# Patient Record
Sex: Male | Born: 1980 | Race: Black or African American | Hispanic: No | Marital: Single | Smoking: Never smoker
Health system: Southern US, Community
[De-identification: ages and names within clinical notes are randomized; demographics above are authoritative.]

## PROBLEM LIST (undated history)

## (undated) DIAGNOSIS — G43909 Migraine, unspecified, not intractable, without status migrainosus: Secondary | ICD-10-CM

## (undated) HISTORY — PX: KNEE SURGERY: SHX244

---

## 2011-04-10 ENCOUNTER — Encounter (HOSPITAL_COMMUNITY): Payer: Self-pay | Admitting: Cardiology

## 2011-04-10 ENCOUNTER — Emergency Department (INDEPENDENT_AMBULATORY_CARE_PROVIDER_SITE_OTHER)
Admission: EM | Admit: 2011-04-10 | Discharge: 2011-04-10 | Disposition: A | Discharge status: Home / Self Care | Payer: BC Managed Care – PPO | Source: Home / Self Care | Attending: Emergency Medicine | Admitting: Emergency Medicine

## 2011-04-10 DIAGNOSIS — S46819A Strain of other muscles, fascia and tendons at shoulder and upper arm level, unspecified arm, initial encounter: Secondary | ICD-10-CM

## 2011-04-10 DIAGNOSIS — H6120 Impacted cerumen, unspecified ear: Secondary | ICD-10-CM

## 2011-04-10 DIAGNOSIS — S43499A Other sprain of unspecified shoulder joint, initial encounter: Secondary | ICD-10-CM

## 2011-04-10 MED ORDER — CYCLOBENZAPRINE HCL 5 MG PO TABS
5.0000 mg | ORAL_TABLET | Freq: Three times a day (TID) | ORAL | Status: AC | PRN
Start: 2011-04-10 — End: 2011-04-20

## 2011-04-10 MED ORDER — DICLOFENAC SODIUM 75 MG PO TBEC: 75.0000 mg | DELAYED_RELEASE_TABLET | Freq: Two times a day (BID) | ORAL | Status: DC

## 2011-04-10 NOTE — ED Notes (Signed)
Pt c/o bilat ear fullness. He has had swimmers ear in the past. The pt also report right shoulder pain. Denies known injury. Using eagle oil to right shoulder with little relief. Took tylenol 1 gram with no relief. Denies fever.

## 2011-04-10 NOTE — ED Provider Notes (Signed)
History     CSN: [id number]  Arrival date & time 04/10/11  1607   First MD Initiated Contact with Patient 04/10/11 1617      Chief Complaint  Patient presents with  . Otitis Externa  . Shoulder Pain    (Consider location/radiation/quality/duration/timing/severity/associated sxs/prior treatment) HPI Comments: Isaiah Morales is a 31 year old male who is in today for 2 complaints, ear congestion and right trapezius ridge pain.  His ear congestion has persisted for the past 1-1/2 weeks. He's tried to remove it with Q-tips. He denies any pain or drainage. He's had no nasal congestion, rhinorrhea, sore throat, or cough.  He also has a two-day history of right posterior trapezius pain. This came on after he was doing some heavy lifting at work at [employer]. It's worse if he does any lifting or activity and better with rest. He has a full range of motion with no pain. There is no pain radiating down the arm, numbness, tingling, or muscle weakness.  Patient is a 31 y.o. male presenting with shoulder pain.  Shoulder Pain Pertinent negatives include no abdominal pain and no shortness of breath.    History reviewed. No pertinent past medical history.  Past Surgical History  Procedure Date  . Knee surgery     right x 2    History reviewed. No pertinent family history.  History  Substance Use Topics  . Smoking status: Never Smoker   . Smokeless tobacco: Not on file  . Alcohol Use: No      Review of Systems  Constitutional: Negative for fever, chills and fatigue.  HENT: Positive for hearing loss. Negative for ear pain, congestion, sore throat, rhinorrhea, sneezing, neck stiffness, voice change and postnasal drip.   Eyes: Negative for pain, discharge and redness.  Respiratory: Negative for cough, chest tightness, shortness of breath and wheezing.   Gastrointestinal: Negative for nausea, vomiting, abdominal pain and diarrhea.  Musculoskeletal: Positive for arthralgias. Negative for myalgias,  back pain, joint swelling and gait problem.  Skin: Negative for rash and wound.  Neurological: Negative for weakness and numbness.    Allergies  Review of patient's allergies indicates no known allergies.  Home Medications   Current Outpatient Rx  Name Route Sig Dispense Refill  . CYCLOBENZAPRINE HCL 5 MG PO TABS Oral Take 1 tablet (5 mg total) by mouth 3 (three) times daily as needed for muscle spasms. 30 tablet 0  . DICLOFENAC SODIUM 75 MG PO TBEC Oral Take 1 tablet (75 mg total) by mouth 2 (two) times daily. 20 tablet 0    BP 115/60  Pulse 69  Temp(Src) 98.8 F (37.1 C) (Oral)  Resp 16  SpO2 100%  Physical Exam  Nursing note and vitals reviewed. Constitutional: He is oriented to person, place, and time. He appears well-developed and well-nourished. No distress.  HENT:  Head: Normocephalic and atraumatic.  Right Ear: External ear normal.  Left Ear: External ear normal.  Nose: Nose normal.  Mouth/Throat: Oropharynx is clear and moist. No oropharyngeal exudate.       There were cerumen impactions in both ear canals.  Eyes: Conjunctivae and EOM are normal. Pupils are equal, round, and reactive to light. Right eye exhibits no discharge. Left eye exhibits no discharge.  Neck: Normal range of motion. Neck supple.  Cardiovascular: Normal rate, regular rhythm and normal heart sounds.   Pulmonary/Chest: Effort normal and breath sounds normal. No stridor. No respiratory distress. He has no wheezes. He has no rales. He exhibits no tenderness.  Musculoskeletal: Normal  range of motion. He exhibits tenderness. He exhibits no edema.       Exam of the right shoulder reveals pain to palpation over the posterior trapezius ridge. The shoulder had a full range of motion actively and passively with just a little bit of pain on internal and external rotation. Muscle strength was normal. Impingement signs are negative.  Lymphadenopathy:    He has no cervical adenopathy.  Neurological: He is alert  and oriented to person, place, and time. He has normal strength and normal reflexes. He displays no atrophy. No sensory deficit. He exhibits normal muscle tone.  Skin: Skin is warm and dry. No rash noted. He is not diaphoretic.    ED Course  Procedures (including critical care time)  Both ears were irrigated out with plain water. Thereafter the ear canals and TMs appear normal.  Labs Reviewed - No data to display No results found.   1. Cerumen impaction   2. Trapezius strain       MDM  He was given some shoulder exercises to do and suggest a followup in 2 weeks with Dr. Magnus Ivan if no improvement.        Roque Lias, MD 04/10/11 2105

## 2011-04-23 ENCOUNTER — Telehealth (HOSPITAL_COMMUNITY): Payer: Self-pay | Admitting: *Deleted

## 2011-04-23 NOTE — ED Notes (Signed)
1/14 Pt. brought FMLA papers due to work note restrictions for 14 days 1/9-1/23.  Dr. Lorenz Coaster filled out papers and I faxed them with patients record to [facility] attn. Tresa Endo 1/14 and confirmation received. I called pt. to pick up originals. Isaiah Morales 04/23/2011

## 2011-09-18 ENCOUNTER — Emergency Department (HOSPITAL_COMMUNITY)
Admission: EM | Admit: 2011-09-18 | Discharge: 2011-09-18 | Disposition: A | Discharge status: Home / Self Care | Payer: BC Managed Care – PPO | Source: Home / Self Care | Attending: Emergency Medicine | Admitting: Emergency Medicine

## 2011-09-18 ENCOUNTER — Encounter (HOSPITAL_COMMUNITY): Payer: Self-pay | Admitting: *Deleted

## 2011-09-18 DIAGNOSIS — R51 Headache: Secondary | ICD-10-CM

## 2011-09-18 DIAGNOSIS — Z569 Unspecified problems related to employment: Secondary | ICD-10-CM

## 2011-09-18 DIAGNOSIS — Z566 Other physical and mental strain related to work: Secondary | ICD-10-CM

## 2011-09-18 MED ORDER — ACETAMINOPHEN-CODEINE #3 300-30 MG PO TABS: 1.0000 | ORAL_TABLET | Freq: Four times a day (QID) | ORAL | Status: AC | PRN

## 2011-09-18 NOTE — Discharge Instructions (Signed)

## 2011-09-18 NOTE — ED Notes (Signed)
Pt with c/o headache onset of  headache today under increased stress at work - pt worried bp might be elevated - having frequent headaches

## 2011-09-18 NOTE — ED Provider Notes (Signed)
History     CSN: [id number]  Arrival date & time 09/18/11  1513   First MD Initiated Contact with Patient 09/18/11 1515      Chief Complaint  Patient presents with  . Headache    (Consider location/radiation/quality/duration/timing/severity/associated sxs/prior treatment) HPI Comments: Since urgent care today complaining of a headache, just wants to check and make sure this is not elated to high blood pressure. She goes into describing how he's been having discussions with his supervisor and today felt that he might to decide leaving this job in order to not compromise his integrity. The headache is not constant and only low localizes for head area. Patient denies any further symptoms such as numbness, weakness, nausea vomiting or visual disturbances. Occasionally takes some over-the-counter medicines like ibuprofen with some partial relief but headaches tend to come back.  Patient denies any constitutional symptoms such as fevers, unintentional weight loss, bodyaches, etc.  Patient is a 31 y.o. male presenting with headaches. The history is provided by the patient.  Headache The primary symptoms include headaches and vomiting. Primary symptoms do not include loss of consciousness, altered mental status, seizures, dizziness, visual change, loss of sensation, speech change, memory loss, fever or nausea. The symptoms are unchanged. Context: current episodeafter having had discussion at work place.  The headache is not associated with eye pain, visual change, neck stiffness or weakness.  Additional symptoms do not include neck stiffness or weakness.    History reviewed. No pertinent past medical history.  Past Surgical History  Procedure Date  . Knee surgery     right x 2    History reviewed. No pertinent family history.  History  Substance Use Topics  . Smoking status: Never Smoker   . Smokeless tobacco: Not on file  . Alcohol Use: No      Review of Systems    Constitutional: Negative for fever, chills, activity change and appetite change.  HENT: Negative for neck stiffness.   Eyes: Negative for pain and visual disturbance.  Respiratory: Negative for shortness of breath.   Gastrointestinal: Positive for vomiting. Negative for nausea.  Skin: Negative for rash.  Neurological: Positive for headaches. Negative for dizziness, tremors, speech change, seizures, loss of consciousness, syncope, facial asymmetry, speech difficulty, weakness, light-headedness and numbness.  Psychiatric/Behavioral: Negative for memory loss and altered mental status.    Allergies  Review of patient's allergies indicates no known allergies.  Home Medications   Current Outpatient Rx  Name Route Sig Dispense Refill  . ACETAMINOPHEN-CODEINE #3 300-30 MG PO TABS Oral Take 1-2 tablets by mouth every 6 (six) hours as needed for pain. 15 tablet 0    BP 111/61  Pulse 70  Temp 98.5 F (36.9 C) (Oral)  Resp 16  SpO2 99%  Physical Exam  Nursing note and vitals reviewed. Constitutional: He is oriented to person, place, and time. He appears well-developed and well-nourished.  Non-toxic appearance. He does not have a sickly appearance. He does not appear ill. No distress.  Eyes: Conjunctivae and EOM are normal. Pupils are equal, round, and reactive to light. Right eye exhibits no discharge.  Neck: Neck supple. No JVD present.  Cardiovascular: Normal rate, regular rhythm, normal heart sounds and intact distal pulses.  Exam reveals no gallop and no friction rub.   No murmur heard. Lymphadenopathy:    He has no cervical adenopathy.  Neurological: He is alert and oriented to person, place, and time. He displays no atrophy, no tremor and normal reflexes. No cranial nerve  deficit or sensory deficit. He exhibits normal muscle tone. He displays no seizure activity. Coordination and gait normal.  Skin: Skin is warm. No erythema.    ED Course  Procedures (including critical care  time)  Labs Reviewed - No data to display No results found.   1. Headache   2. Stress at work       MDM  Headache with no further neurological symptoms. Patient with work-related stress.        Jimmie Molly, MD 09/18/11 [phone]

## 2011-12-01 ENCOUNTER — Encounter (HOSPITAL_COMMUNITY): Payer: Self-pay | Admitting: *Deleted

## 2011-12-01 DIAGNOSIS — G43909 Migraine, unspecified, not intractable, without status migrainosus: Secondary | ICD-10-CM | POA: Insufficient documentation

## 2011-12-01 NOTE — ED Notes (Signed)
Pt states that he has hx of migraines, pt states this mirgraine started a couple of hours ago. Pt alert and oriented, able to follow commands and move extremities. Pt states he took 800mg  Ibuprofen with no relief and has no rx meds for mirgrianes.

## 2011-12-02 ENCOUNTER — Emergency Department (HOSPITAL_COMMUNITY)
Admission: EM | Admit: 2011-12-02 | Discharge: 2011-12-02 | Disposition: A | Discharge status: Home / Self Care | Payer: Self-pay | Attending: Emergency Medicine | Admitting: Emergency Medicine

## 2011-12-02 DIAGNOSIS — R51 Headache: Secondary | ICD-10-CM

## 2011-12-02 HISTORY — DX: Migraine, unspecified, not intractable, without status migrainosus: G43.909

## 2011-12-02 MED ORDER — DIPHENHYDRAMINE HCL 50 MG/ML IJ SOLN
25.0000 mg | Freq: Once | INTRAMUSCULAR | Status: AC
  Administered 2011-12-02: 25 mg via INTRAVENOUS
  Filled 2011-12-02: qty 1

## 2011-12-02 MED ORDER — METOCLOPRAMIDE HCL 5 MG/ML IJ SOLN
10.0000 mg | Freq: Once | INTRAMUSCULAR | Status: AC
  Administered 2011-12-02: 10 mg via INTRAMUSCULAR
  Filled 2011-12-02: qty 2

## 2011-12-02 MED ORDER — SODIUM CHLORIDE 0.9 % IV BOLUS (SEPSIS)
1000.0000 mL | Freq: Once | INTRAVENOUS | Status: AC
  Administered 2011-12-02: 1000 mL via INTRAVENOUS

## 2011-12-02 MED ORDER — DEXAMETHASONE SODIUM PHOSPHATE 10 MG/ML IJ SOLN
10.0000 mg | Freq: Once | INTRAMUSCULAR | Status: AC
  Administered 2011-12-02: 10 mg via INTRAMUSCULAR
  Filled 2011-12-02: qty 1

## 2011-12-02 NOTE — ED Provider Notes (Signed)
History     CSN: [id number]  Arrival date & time 12/01/11  2047   First MD Initiated Contact with Patient 12/02/11 0055      Chief Complaint  Patient presents with  . Headache    migriane    (Consider location/radiation/quality/duration/timing/severity/associated sxs/prior treatment) HPI Comments: Patient is a 31 year old man with a history of migraines presents emergency department with a chief complaint of migraine.  Onset of headache began a couple of hours ago and is described as a bilateral temporal throbbing that is associated with nausea & photophobia.  Patient denies any emesis, change in vision, disequilibrium, ataxia, fever, night sweats or chills.  Headache is worsened by movement and loud noise.  Patient is taken 800 mg ibuprofen at 9 o'clock tonight without relief.  No other complaints this time.  Patient is a 31 y.o. male presenting with headaches. The history is provided by the patient.  Headache  Associated symptoms include nausea. Pertinent negatives include no fever.    Past Medical History  Diagnosis Date  . Migraine     Past Surgical History  Procedure Date  . Knee surgery     right x 2    History reviewed. No pertinent family history.  History  Substance Use Topics  . Smoking status: Never Smoker   . Smokeless tobacco: Not on file  . Alcohol Use: No      Review of Systems  Constitutional: Negative for fever, diaphoresis and activity change.  HENT: Negative for congestion and neck pain.   Respiratory: Negative for cough.   Gastrointestinal: Positive for nausea.  Genitourinary: Negative for dysuria.  Musculoskeletal: Negative for myalgias.  Skin: Negative for color change and wound.  Neurological: Positive for headaches.  All other systems reviewed and are negative.    Allergies  Review of patient's allergies indicates no known allergies.  Home Medications  No current outpatient prescriptions on file.  BP 116/65  Pulse 67  Temp 97.6  F (36.4 C) (Oral)  Resp 18  SpO2 100%  Physical Exam  Nursing note and vitals reviewed. Constitutional: He is oriented to person, place, and time. He appears well-developed and well-nourished. No distress.  HENT:  Head: Normocephalic and atraumatic.  Right Ear: External ear normal.  Left Ear: External ear normal.  Eyes: Conjunctivae and EOM are normal. Pupils are equal, round, and reactive to light. No scleral icterus.  Neck: Normal range of motion and full passive range of motion without pain. No spinous process tenderness present. No rigidity.       No nuchal rigidity, pain free ROM  Cardiovascular: Normal rate and regular rhythm.   Pulmonary/Chest: Effort normal. No respiratory distress.  Musculoskeletal: Normal range of motion.  Neurological: He is alert and oriented to person, place, and time. No sensory deficit.       CN III-XII intact. Good coordination, no nystagmus, strength 5/5 bilaterally  Skin: Skin is warm and dry. No rash noted. He is not diaphoretic.    ED Course  Procedures (including critical care time)  Labs Reviewed - No data to display No results found.   No diagnosis found.    MDM  Migraine  Pt HA treated and improved while in ED.  Presentation is like pts typical HA and non concerning for [facility], ICH, Meningitis, or temporal arteritis. Pt is afebrile with no focal neuro deficits, nuchal rigidity, or change in vision. Pt is to follow up with PCP to discuss prophylactic medication. Pt verbalizes understanding and is agreeable with plan to  Weston Settle, PA-C 12/04/11 [phone]

## 2011-12-02 NOTE — ED Provider Notes (Signed)
Medical screening examination/treatment/procedure(s) were conducted as a shared visit with non-physician practitioner(s) and myself.  I personally evaluated the patient during the encounter  Pt with history of migraine has typical headache onset several hours prior to arrival. Given IV meds and fluids in the ED and feeling better, sleeping comfortably and ready to go home.   Charles B. Bernette Mayers, MD 12/02/11 [phone]

## 2011-12-02 NOTE — ED Notes (Signed)
C/o HA, onset 1500, gradual onset, onset after church activities, (denies: dizziness, nvd, fever, bleeding or other sx), mentions stomach upset for a few days & stool softer than usual. "does not feel like he is dehydrated".

## 2012-03-10 ENCOUNTER — Emergency Department (INDEPENDENT_AMBULATORY_CARE_PROVIDER_SITE_OTHER)
Admission: EM | Admit: 2012-03-10 | Discharge: 2012-03-10 | Disposition: A | Discharge status: Home / Self Care | Payer: BC Managed Care – PPO | Source: Home / Self Care | Attending: Family Medicine | Admitting: Family Medicine

## 2012-03-10 ENCOUNTER — Encounter (HOSPITAL_COMMUNITY): Payer: Self-pay | Admitting: *Deleted

## 2012-03-10 ENCOUNTER — Emergency Department (INDEPENDENT_AMBULATORY_CARE_PROVIDER_SITE_OTHER): Payer: BC Managed Care – PPO

## 2012-03-10 DIAGNOSIS — T148XXA Other injury of unspecified body region, initial encounter: Secondary | ICD-10-CM

## 2012-03-10 DIAGNOSIS — Z23 Encounter for immunization: Secondary | ICD-10-CM

## 2012-03-10 MED ORDER — CEFTRIAXONE SODIUM 1 G IJ SOLR
INTRAMUSCULAR | Status: AC
  Filled 2012-03-10: qty 10

## 2012-03-10 MED ORDER — TETANUS-DIPHTH-ACELL PERTUSSIS 5-2.5-18.5 LF-MCG/0.5 IM SUSP
0.5000 mL | Freq: Once | INTRAMUSCULAR | Status: AC
  Administered 2012-03-10: 0.5 mL via INTRAMUSCULAR

## 2012-03-10 MED ORDER — CEFTRIAXONE SODIUM 1 G IJ SOLR
1.0000 g | Freq: Once | INTRAMUSCULAR | Status: AC
  Administered 2012-03-10: 1 g via INTRAMUSCULAR

## 2012-03-10 MED ORDER — CIPROFLOXACIN HCL 500 MG PO TABS: 500.0000 mg | ORAL_TABLET | Freq: Two times a day (BID) | ORAL | Status: DC

## 2012-03-10 MED ORDER — AMOXICILLIN-POT CLAVULANATE 500-125 MG PO TABS: 1.0000 | ORAL_TABLET | Freq: Three times a day (TID) | ORAL | Status: DC

## 2012-03-10 MED ORDER — TETANUS-DIPHTH-ACELL PERTUSSIS 5-2.5-18.5 LF-MCG/0.5 IM SUSP
INTRAMUSCULAR | Status: AC
  Filled 2012-03-10: qty 0.5

## 2012-03-10 MED ORDER — IBUPROFEN 600 MG PO TABS: 600.0000 mg | ORAL_TABLET | Freq: Three times a day (TID) | ORAL | Status: DC | PRN

## 2012-03-10 NOTE — ED Notes (Signed)
Pt  Reports  He  Stepped  On a  Nail        Yesterday           He  Is  Unsure  Of  His  Last tet  Shot       The  l  Foot  Has  Some  Pain on  Weight bearing

## 2012-03-10 NOTE — ED Notes (Signed)
l  Adult  Post  [employer]

## 2012-03-10 NOTE — ED Provider Notes (Signed)
History     CSN: [id number]  Arrival date & time 03/10/12  1154   First MD Initiated Contact with Patient 03/10/12 1255      Chief Complaint  Patient presents with  . Puncture Wound    (Consider location/radiation/quality/duration/timing/severity/associated sxs/prior treatment) HPI Comments: 31 year old nondiabetic male. Here complaining of left foot pain. Patient reports he injured his left foot while stepping over a clean new long nail causing a puncture wound yesterday evening. Patient was wearing sneakers and he states that lives 1 inch or more of the nail went inside his sole. He has used peroxide wash his wound, reports pain with walking at the puncture site. No drainage. No redness or swelling. Unsure about last tetanus shot. Not taking any medications for his symptoms.   Past Medical History  Diagnosis Date  . Migraine     Past Surgical History  Procedure Date  . Knee surgery     right x 2    No family history on file.  History  Substance Use Topics  . Smoking status: Never Smoker   . Smokeless tobacco: Not on file  . Alcohol Use: No      Review of Systems  Constitutional: Negative for fever and chills.  Musculoskeletal:       Left foot sole pain as per history of present illness  Skin: Positive for wound.  All other systems reviewed and are negative.    Allergies  Review of patient's allergies indicates no known allergies.  Home Medications   Current Outpatient Rx  Name  Route  Sig  Dispense  Refill  . AMOXICILLIN-POT CLAVULANATE 500-125 MG PO TABS   Oral   Take 1 tablet (500 mg total) by mouth 3 (three) times daily.   21 tablet   0   . CIPROFLOXACIN HCL 500 MG PO TABS   Oral   Take 1 tablet (500 mg total) by mouth every 12 (twelve) hours.   10 tablet   0   . IBUPROFEN 600 MG PO TABS   Oral   Take 1 tablet (600 mg total) by mouth every 8 (eight) hours as needed for pain.   20 tablet   0     BP 120/80  Pulse 72  Temp 98.6 F (37  C) (Oral)  Resp 16  SpO2 100%  Physical Exam  Nursing note and vitals reviewed. Constitutional: He is oriented to person, place, and time. He appears well-developed and well-nourished. No distress.  HENT:  Head: Normocephalic and atraumatic.  Cardiovascular: Normal heart sounds.   Pulmonary/Chest: Breath sounds normal.  Neurological: He is alert and oriented to person, place, and time.  Skin: He is not diaphoretic.       There is a puncture wound  between first MP joint and second metatarsal bone in the sole of the left foot. No bleeding or drainage. There is tenderness around the wound with deep palpation. No significant erythema. No obvious swelling. No fluctuation. Patient can flex and extend first MP joint without significant discomfort. Patient is weightbearing but reports discomfort when putting weight over wounded area     ED Course  Procedures (including critical care time)  Labs Reviewed - No data to display Dg Foot Complete Left  03/10/2012  *RADIOLOGY REPORT*  Clinical Data: Puncture wound to bottom of foot around first MTP joint  LEFT FOOT - COMPLETE 3+ VIEW  Comparison: None.  Findings: No fracture or dislocation.  Regional soft tissues are normal.  No radiopaque foreign body.  Joint spaces are preserved. No erosions.  IMPRESSION: No fracture or radiopaque foreign body.   Original Report Authenticated By: Tacey Ruiz, MD      1. Puncture wound       MDM  Treated with Rocephin and 1 g IM x1 here. Sent to [place] for x-rays. Treated with ibuprofen, Augmentin and Cipro to cover for anaerobs and Pseudomonas. Place on a postop boot. TDaP administered.  Asked to go to the health department if worsening pain swelling or redness despite following treatment.  Sharin Grave, MD 03/10/12 1500

## 2012-04-27 ENCOUNTER — Emergency Department (INDEPENDENT_AMBULATORY_CARE_PROVIDER_SITE_OTHER)
Admission: EM | Admit: 2012-04-27 | Discharge: 2012-04-27 | Disposition: A | Discharge status: Home / Self Care | Payer: BC Managed Care – PPO | Source: Home / Self Care | Attending: Emergency Medicine | Admitting: Emergency Medicine

## 2012-04-27 ENCOUNTER — Encounter (HOSPITAL_COMMUNITY): Payer: Self-pay | Admitting: Emergency Medicine

## 2012-04-27 DIAGNOSIS — G43909 Migraine, unspecified, not intractable, without status migrainosus: Secondary | ICD-10-CM

## 2012-04-27 MED ORDER — METOCLOPRAMIDE HCL 5 MG/ML IJ SOLN
INTRAMUSCULAR | Status: AC
  Filled 2012-04-27: qty 2

## 2012-04-27 MED ORDER — KETOROLAC TROMETHAMINE 60 MG/2ML IM SOLN
60.0000 mg | Freq: Once | INTRAMUSCULAR | Status: AC
  Administered 2012-04-27: 60 mg via INTRAMUSCULAR

## 2012-04-27 MED ORDER — DEXAMETHASONE SODIUM PHOSPHATE 10 MG/ML IJ SOLN
INTRAMUSCULAR | Status: AC
  Filled 2012-04-27: qty 1

## 2012-04-27 MED ORDER — NAPROXEN 500 MG PO TABS: 500.0000 mg | ORAL_TABLET | Freq: Two times a day (BID) | ORAL | Status: DC

## 2012-04-27 MED ORDER — DEXAMETHASONE SODIUM PHOSPHATE 10 MG/ML IJ SOLN
10.0000 mg | Freq: Once | INTRAMUSCULAR | Status: AC
  Administered 2012-04-27: 10 mg via INTRAMUSCULAR

## 2012-04-27 MED ORDER — SUMATRIPTAN SUCCINATE 50 MG PO TABS: 50.0000 mg | ORAL_TABLET | ORAL | Status: DC | PRN

## 2012-04-27 MED ORDER — METOCLOPRAMIDE HCL 5 MG/ML IJ SOLN
10.0000 mg | Freq: Once | INTRAMUSCULAR | Status: AC
  Administered 2012-04-27: 10 mg via INTRAMUSCULAR

## 2012-04-27 MED ORDER — KETOROLAC TROMETHAMINE 60 MG/2ML IM SOLN
INTRAMUSCULAR | Status: AC
  Filled 2012-04-27: qty 2

## 2012-04-27 NOTE — ED Provider Notes (Signed)
Chief Complaint  Patient presents with  . Migraine    History of Present Illness:   Isaiah Morales is a 32 year old male who presents today with a migraine headache. The patient has had migraines for years. He's never seen a headache specialist, had an MRI, or taken any medications for prevention or treatment of migraines. He usually self treats with over-the-counter medications. The headache began around 7 AM this morning. It was brought on by being exposed to bright fluorescent lights at work. He describes a bilateral throbbing headache rated 9/10 in intensity with associated nausea, photophobia, and phonophobia. He has had some visual spots. He denies any diplopia he denies numbness, tingling, weakness, or difficulty with speech, swallowing, or ambulation.   Review of Systems:  Other than noted above, the patient denies any of the following symptoms: Systemic:  No fever, chills, fatigue, photophobia, stiff neck. Eye:  No redness, eye pain, discharge, blurred vision, or diplopia. ENT:  No nasal congestion, rhinorrhea, sinus pressure or pain, sneezing, earache, or sore throat.  No jaw claudication. Neuro:  No paresthesias, loss of consciousness, seizure activity, muscle weakness, trouble with coordination or gait, trouble speaking or swallowing. Psych:  No depression, anxiety or trouble sleeping.  PMFSH:  Past medical history, family history, social history, meds, and allergies were reviewed.  Physical Exam:   Vital signs:  BP 151/83  Pulse 76  Temp 98.1 F (36.7 C) (Oral)  Resp 16  SpO2 98% General:  Alert and oriented.  In no distress. Eye:  Lids and conjunctivas normal.  PERRL,  Full EOMs.  Fundi benign with normal discs and vessels. ENT:  No cranial or facial tenderness to palpation.  TMs and canals clear.  Nasal mucosa was normal and uncongested without any drainage. No intra oral lesions, pharynx clear, mucous membranes moist, dentition normal. Neck:  Supple, full ROM, no tenderness  to palpation.  No adenopathy or mass. Neuro:  Alert and orented times 3.  Speech was clear, fluent, and appropriate.  Cranial nerves intact. No pronator drift, muscle strength normal. Finger to nose normal.  DTRs were 2+ and symmetrical.Station and gait were normal.  Romberg's sign was normal.  Able to perform tandem gait well. Psych:  Normal affect.  Medications given in [facility]:   He was given Decadron 10 mg IM, Toradol 60 mg IM, and metoclopramide 10 mg IM. He tolerated these well without any immediate side effects.   Assessment:  The encounter diagnosis was Migraine headache.  Plan:   1.  The following meds were prescribed:   New Prescriptions   NAPROXEN (NAPROSYN) 500 MG TABLET    Take 1 tablet (500 mg total) by mouth 2 (two) times daily.   SUMATRIPTAN (IMITREX) 50 MG TABLET    Take 1 tablet (50 mg total) by mouth every 2 (two) hours as needed for migraine.   2.  The patient was instructed in symptomatic care and handouts were given. 3.  The patient was told to return if becoming worse in any way, if no better in 3 or 4 days, and given some red flag symptoms that would indicate earlier return.  Follow up:  The patient was told to follow up with Dr. Karenann Cai for evaluation and treatment of migraine headaches. He has prescriptions for naproxen and Imitrex if the headache should recur in the meantime.     Reuben Likes, MD 04/27/12 1226

## 2012-04-27 NOTE — ED Notes (Addendum)
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

## 2012-06-15 ENCOUNTER — Encounter (HOSPITAL_COMMUNITY): Payer: Self-pay | Admitting: *Deleted

## 2012-06-15 ENCOUNTER — Emergency Department (HOSPITAL_COMMUNITY)
Admission: EM | Admit: 2012-06-15 | Discharge: 2012-06-16 | Disposition: A | Discharge status: Home / Self Care | Payer: BC Managed Care – PPO | Attending: Emergency Medicine | Admitting: Emergency Medicine

## 2012-06-15 ENCOUNTER — Emergency Department (HOSPITAL_COMMUNITY): Payer: BC Managed Care – PPO

## 2012-06-15 DIAGNOSIS — G43909 Migraine, unspecified, not intractable, without status migrainosus: Secondary | ICD-10-CM | POA: Insufficient documentation

## 2012-06-15 DIAGNOSIS — Z79899 Other long term (current) drug therapy: Secondary | ICD-10-CM | POA: Insufficient documentation

## 2012-06-15 DIAGNOSIS — R079 Chest pain, unspecified: Secondary | ICD-10-CM | POA: Insufficient documentation

## 2012-06-15 LAB — CARBOXYHEMOGLOBIN
Carboxyhemoglobin: 0.7 % (ref 0.5–1.5)
O2 Saturation: 85.4 %

## 2012-06-15 NOTE — ED Provider Notes (Signed)
History     CSN: [id number]  Arrival date & time 06/15/12  2104   First MD Initiated Contact with Patient 06/15/12 2131      Chief Complaint  Patient presents with  . Generalized Body Aches    (Consider location/radiation/quality/duration/timing/severity/associated sxs/prior treatment) The history is provided by the patient.  pt states hx 'migraines', indicates earlier in day had gradual onset frontal headache, felt like prior migraine, dull, constant, not throbbing. Denies eye pain or change in vision. No numbness/weakness. No sinus drainage, congestion or pain. No recent head trauma or injury. No loc. No fever or chills. No neck pain or stiffness. Took a vicodin, pain eventually improved. States then noted dull anterior cp. Non radiating. Lasted couple minutes, at rest. No associated sob, nv or diaphoresis. No recent unusual fatigue or doe. No pleuritic pain. Denies cough or uri c/o. No leg pain or swelling. Denies hx chd, cad, or family hx cad. Non smoker, no alcohol or any drug use. No recent exertional cp or discomfort. Pt indicates as was cold today he was using kerosene heater inside house. Denies any odor or fumes in home.     Past Medical History  Diagnosis Date  . Migraine     Past Surgical History  Procedure Laterality Date  . Knee surgery      right x 2    History reviewed. No pertinent family history.  History  Substance Use Topics  . Smoking status: Never Smoker   . Smokeless tobacco: Not on file  . Alcohol Use: No      Review of Systems  Constitutional: Negative for fever and chills.  HENT: Negative for neck pain and neck stiffness.   Eyes: Negative for pain, redness and visual disturbance.  Respiratory: Negative for cough and shortness of breath.   Cardiovascular: Negative for chest pain, palpitations and leg swelling.  Gastrointestinal: Negative for nausea, vomiting and abdominal pain.  Genitourinary: Negative for flank pain.  Musculoskeletal:  Negative for back pain.  Skin: Negative for rash.  Neurological: Positive for headaches. Negative for syncope, weakness and numbness.  Hematological: Does not bruise/bleed easily.  Psychiatric/Behavioral: Negative for confusion.    Allergies  Review of patient's allergies indicates no known allergies.  Home Medications   Current Outpatient Rx  Name  Route  Sig  Dispense  Refill  . ibuprofen (ADVIL,MOTRIN) 600 MG tablet   Oral   Take 1 tablet (600 mg total) by mouth every 8 (eight) hours as needed for pain.   20 tablet   0   . SUMAtriptan (IMITREX) 50 MG tablet   Oral   Take 1 tablet (50 mg total) by mouth every 2 (two) hours as needed for migraine.   10 tablet   0     BP 113/73  Pulse 68  Temp(Src) 97.3 F (36.3 C) (Oral)  Resp 16  SpO2 98%  Physical Exam  Nursing note and vitals reviewed. Constitutional: He is oriented to person, place, and time. He appears well-developed and well-nourished. No distress.  HENT:  Head: Atraumatic.  Mouth/Throat: Oropharynx is clear and moist.  Eyes: Conjunctivae and EOM are normal. Pupils are equal, round, and reactive to light.  Neck: Normal range of motion. Neck supple. No tracheal deviation present.  Cardiovascular: Normal rate, regular rhythm, normal heart sounds and intact distal pulses.  Exam reveals no gallop and no friction rub.   No murmur heard. Pulmonary/Chest: Effort normal and breath sounds normal. No accessory muscle usage. No respiratory distress. He exhibits no  tenderness.  Abdominal: Soft. He exhibits no distension. There is no tenderness.  Musculoskeletal: Normal range of motion. He exhibits no edema and no tenderness.  Neurological: He is alert and oriented to person, place, and time.  Motor intact bil. Steady gait.   Skin: Skin is warm and dry.  Psychiatric: He has a normal mood and affect.    ED Course  Procedures (including critical care time)  Results for orders placed during the hospital encounter of  06/15/12  TROPONIN I      Result Value Range   Troponin I <0.30  <0.30 ng/mL  CARBOXYHEMOGLOBIN      Result Value Range   Total hemoglobin 14.5  13.5 - 18.0 g/dL   O2 Saturation 11.9     Carboxyhemoglobin 0.7  0.5 - 1.5 %   Methemoglobin 0.7  0.0 - 1.5 %   Dg Chest 2 View  06/15/2012  *RADIOLOGY REPORT*  Clinical Data: Chest pain after a migraine headaches.  CHEST - 2 VIEW  Comparison: None.  Findings: Cardiac silhouette upper normal in size to slightly enlarged for age.  Hilar and mediastinal contours otherwise unremarkable.  Hyperinflation with flattening of hemidiaphragms. Lungs clear.  Bronchovascular markings normal.  Pulmonary vascularity normal.  No pneumothorax.  No pleural effusions. Visualized bony thorax intact.  IMPRESSION: Borderline heart size for age.  Hyperinflation.  No acute cardiopulmonary disease.   Original Report Authenticated By: Hulan Saas, M.D.        MDM  Labs. Cxr.   Recheck no pain, comfortable.   Date: 06/15/2012  Rate: 71  Rhythm: normal sinus rhythm  QRS Axis: normal  Intervals: normal  ST/T Wave abnormalities: nonspecific ST/T changes  Conduction Disutrbances:none  Narrative Interpretation:   Old EKG Reviewed: none available  Pt w v atypical symptoms/pain. Headache and cp have resolved. cxr neg acute, trop neg, co neg. No risk factors for cad.   Pt appears stable for d/c.            Suzi Roots, MD 06/15/12 2324

## 2012-06-15 NOTE — ED Notes (Signed)
Pt states that he has a slight headache; pt denies blurred vision and sensitivity to light; pt resting in bed

## 2012-06-15 NOTE — ED Notes (Signed)
accidentally "clicked off" DG chest; still needs to be completed at this time; pt being transported to [facility]

## 2012-06-15 NOTE — ED Notes (Signed)
Per EMS: Pt is out of pain medications. Pt states that the cold outside acts up his right knee pain that he had surgery on 7 years ago. Pt also having generalized body pain that started a couple of days ago.

## 2013-08-28 ENCOUNTER — Emergency Department (HOSPITAL_COMMUNITY)
Admission: EM | Admit: 2013-08-28 | Discharge: 2013-08-28 | Disposition: A | Discharge status: Home / Self Care | Payer: BC Managed Care – PPO | Source: Home / Self Care | Attending: Emergency Medicine | Admitting: Emergency Medicine

## 2013-08-28 ENCOUNTER — Encounter (HOSPITAL_COMMUNITY): Payer: Self-pay | Admitting: Emergency Medicine

## 2013-08-28 DIAGNOSIS — M436 Torticollis: Secondary | ICD-10-CM

## 2013-08-28 MED ORDER — HYDROMORPHONE HCL PF 1 MG/ML IJ SOLN
INTRAMUSCULAR | Status: AC
  Filled 2013-08-28: qty 1

## 2013-08-28 MED ORDER — OXYCODONE-ACETAMINOPHEN 5-325 MG PO TABS: ORAL_TABLET | ORAL | Status: DC

## 2013-08-28 MED ORDER — LORAZEPAM 2 MG/ML IJ SOLN
1.0000 mg | Freq: Once | INTRAMUSCULAR | Status: AC
  Administered 2013-08-28: 1 mg via INTRAMUSCULAR

## 2013-08-28 MED ORDER — LORAZEPAM 2 MG/ML IJ SOLN
INTRAMUSCULAR | Status: AC
  Filled 2013-08-28: qty 1

## 2013-08-28 MED ORDER — CYCLOBENZAPRINE HCL 10 MG PO TABS: 10.0000 mg | ORAL_TABLET | Freq: Three times a day (TID) | ORAL | Status: DC | PRN

## 2013-08-28 MED ORDER — HYDROMORPHONE HCL PF 1 MG/ML IJ SOLN
1.0000 mg | Freq: Once | INTRAMUSCULAR | Status: AC
  Administered 2013-08-28: 1 mg via INTRAMUSCULAR

## 2013-08-28 MED ORDER — ONDANSETRON 4 MG PO TBDP
8.0000 mg | ORAL_TABLET | Freq: Once | ORAL | Status: AC
  Administered 2013-08-28: 8 mg via ORAL

## 2013-08-28 MED ORDER — ONDANSETRON 4 MG PO TBDP
ORAL_TABLET | ORAL | Status: AC
  Filled 2013-08-28: qty 2

## 2013-08-28 MED ORDER — LORAZEPAM 1 MG PO TABS: 1.0000 mg | ORAL_TABLET | Freq: Three times a day (TID) | ORAL | Status: DC | PRN

## 2013-08-28 NOTE — Discharge Instructions (Signed)
TREATMENT  °Treatment initially involves the use of ice and medication to help reduce pain and inflammation. It is also important to perform strengthening and stretching exercises and modify activities that worsen symptoms so the injury does not get worse. These exercises may be performed at home or with a therapist. For patients who experience severe symptoms, a soft padded collar may be recommended to be worn around the neck.  °Improving your posture may help reduce symptoms. Posture improvement includes pulling your chin and abdomen in while sitting or standing. If you are sitting, sit in a firm chair with your buttocks against the back of the chair. While sleeping, try replacing your pillow with a small towel rolled to 2 inches in diameter, or use a cervical pillow. Poor sleeping positions delay healing.  ° °MEDICATION  °· If pain medication is necessary, nonsteroidal anti-inflammatory medications, such as aspirin and ibuprofen, or other minor pain relievers, such as acetaminophen, are often recommended. °· Do not take pain medication for 7 days before surgery. °· Prescription pain relievers may be given if deemed necessary by your caregiver. Use only as directed and only as much as you need. ° °HEAT AND COLD:  °· Cold treatment (icing) relieves pain and reduces inflammation. Cold treatment should be applied for 10 to 15 minutes every 2 to 3 hours for inflammation and pain and immediately after any activity that aggravates your symptoms. Use ice packs or an ice massage. °· Heat treatment may be used prior to performing the stretching and strengthening activities prescribed by your caregiver, physical therapist, or athletic trainer. Use a heat pack or a warm soak. ° °SEEK MEDICAL CARE IF:  °· Symptoms get worse or do not improve in 2 weeks despite treatment. °· New, unexplained symptoms develop (drugs used in treatment may produce side effects). ° °EXERCISES °RANGE OF MOTION (ROM) AND STRETCHING EXERCISES -  Cervical Strain and Sprain °These exercises may help you when beginning to rehabilitate your injury. In order to successfully resolve your symptoms, you must improve your posture. These exercises are designed to help reduce the forward-head and rounded-shoulder posture which contributes to this condition. Your symptoms may resolve with or without further involvement from your physician, physical therapist or athletic trainer. While completing these exercises, remember:  °· Restoring tissue flexibility helps normal motion to return to the joints. This allows healthier, less painful movement and activity. °· An effective stretch should be held for at least 20 seconds, although you may need to begin with shorter hold times for comfort. °· A stretch should never be painful. You should only feel a gentle lengthening or release in the stretched tissue. ° °STRETCH- Axial Extensors °· Lie on your back on the floor. You may bend your knees for comfort. Place a rolled up hand towel or dish towel, about 2 inches in diameter, under the part of your head that makes contact with the floor. °· Gently tuck your chin, as if trying to make a "double chin," until you feel a gentle stretch at the base of your head. °· Hold _____10_____ seconds. °Repeat _____10_____ times. Complete this exercise _____2_____ times per day.  ° °STRETECH - Axial Extension  °· Stand or sit on a firm surface. Assume a good posture: chest up, shoulders drawn back, abdominal muscles slightly tense, knees unlocked (if standing) and feet hip width apart. °· Slowly retract your chin so your head slides back and your chin slightly lowers.Continue to look straight ahead. °· You should feel a gentle stretch   in the back of your head. Be certain not to feel an aggressive stretch since this can cause headaches later. °· Hold for ____10______ seconds. °Repeat _____10_____ times. Complete this exercise ____2______ times per day. ° °STRETCH  Cervical Side Bend  °· Stand  or sit on a firm surface. Assume a good posture: chest up, shoulders drawn back, abdominal muscles slightly tense, knees unlocked (if standing) and feet hip width apart. °· Without letting your nose or shoulders move, slowly tip your right / left ear to your shoulder until your feel a gentle stretch in the muscles on the opposite side of your neck. °· Hold _____10_____ seconds. °Repeat _____10_____ times. Complete this exercise _____2_____ times per day. ° °STRETCH  Cervical Rotators  °· Stand or sit on a firm surface. Assume a good posture: chest up, shoulders drawn back, abdominal muscles slightly tense, knees unlocked (if standing) and feet hip width apart. °· Keeping your eyes level with the ground, slowly turn your head until you feel a gentle stretch along the back and opposite side of your neck. °· Hold _____10_____ seconds. °Repeat ____10______ times. Complete this exercise ____2______ times per day. ° °RANGE OF MOTION - Neck Circles  °· Stand or sit on a firm surface. Assume a good posture: chest up, shoulders drawn back, abdominal muscles slightly tense, knees unlocked (if standing) and feet hip width apart. °· Gently roll your head down and around from the back of one shoulder to the back of the other. The motion should never be forced or painful. °· Repeat the motion 10-20 times, or until you feel the neck muscles relax and loosen. °Repeat ____10______ times. Complete the exercise _____2_____ times per day. ° °STRENGTHENING EXERCISES - Cervical Strain and Sprain °These exercises may help you when beginning to rehabilitate your injury. They may resolve your symptoms with or without further involvement from your physician, physical therapist or athletic trainer. While completing these exercises, remember:  °· Muscles can gain both the endurance and the strength needed for everyday activities through controlled exercises. °· Complete these exercises as instructed by your physician, physical therapist or  athletic trainer. Progress the resistance and repetitions only as guided. °· You may experience muscle soreness or fatigue, but the pain or discomfort you are trying to eliminate should never worsen during these exercises. If this pain does worsen, stop and make certain you are following the directions exactly. If the pain is still present after adjustments, discontinue the exercise until you can discuss the trouble with your clinician. ° °STRENGTH Cervical Flexors, Isometric °· Face a wall, standing about 6 inches away. Place a small pillow, a ball about 6-8 inches in diameter, or a folded towel between your forehead and the wall. °· Slightly tuck your chin and gently push your forehead into the soft object. Push only with mild to moderate intensity, building up tension gradually. Keep your jaw and forehead relaxed. °· Hold 10 to 20 seconds. Keep your breathing relaxed. °· Release the tension slowly. Relax your neck muscles completely before you start the next repetition. °Repeat _____10_____ times. Complete this exercise _____2_____ times per day. ° °STRENGTH- Cervical Lateral Flexors, Isometric  °· Stand about 6 inches away from a wall. Place a small pillow, a ball about 6-8 inches in diameter, or a folded towel between the side of your head and the wall. °· Slightly tuck your chin and gently tilt your head into the soft object. Push only with mild to moderate intensity, building up tension gradually. Keep   your jaw and forehead relaxed. °· Hold 10 to 20 seconds. Keep your breathing relaxed. °· Release the tension slowly. Relax your neck muscles completely before you start the next repetition. °Repeat _____10_____ times. Complete this exercise ____2______ times per day. ° °STRENGTH  Cervical Extensors, Isometric  °· Stand about 6 inches away from a wall. Place a small pillow, a ball about 6-8 inches in diameter, or a folded towel between the back of your head and the wall. °· Slightly tuck your chin and gently  tilt your head back into the soft object. Push only with mild to moderate intensity, building up tension gradually. Keep your jaw and forehead relaxed. °· Hold 10 to 20 seconds. Keep your breathing relaxed. °· Release the tension slowly. Relax your neck muscles completely before you start the next repetition. °Repeat _____10_____ times. Complete this exercise _____2_____ times per day. ° °POSTURE AND BODY MECHANICS CONSIDERATIONS - Cervical Strain and Sprain °Keeping correct posture when sitting, standing or completing your activities will reduce the stress put on different body tissues, allowing injured tissues a chance to heal and limiting painful experiences. The following are general guidelines for improved posture. Your physician or physical therapist will provide you with any instructions specific to your needs. While reading these guidelines, remember: °· The exercises prescribed by your provider will help you have the flexibility and strength to maintain correct postures. °· The correct posture provides the optimal environment for your joints to work. All of your joints have less wear and tear when properly supported by a spine with good posture. This means you will experience a healthier, less painful body. °· Correct posture must be practiced with all of your activities, especially prolonged sitting and standing. Correct posture is as important when doing repetitive low-stress activities (typing) as it is when doing a single heavy-load activity (lifting). °PROLONGED STANDING WHILE SLIGHTLY LEANING FORWARD °When completing a task that requires you to lean forward while standing in one place for a long time, place either foot up on a stationary 2-4 inch high object to help maintain the best posture. When both feet are on the ground, the low back tends to lose its slight inward curve. If this curve flattens (or becomes too large), then the back and your other joints will experience too much stress, fatigue  more quickly and can cause pain.  °RESTING POSITIONS °Consider which positions are most painful for you when choosing a resting position. If you have pain with flexion-based activities (sitting, bending, stooping, squatting), choose a position that allows you to rest in a less flexed posture. You would want to avoid curling into a fetal position on your side. If your pain worsens with extension-based activities (prolonged standing, working overhead), avoid resting in an extended position such as sleeping on your stomach. Most people will find more comfort when they rest with their spine in a more neutral position, neither too rounded nor too arched. Lying on a non-sagging bed on your side with a pillow between your knees, or on your back with a pillow under your knees will often provide some relief. Keep in mind, being in any one position for a prolonged period of time, no matter how correct your posture, can still lead to stiffness. °WALKING °Walk with an upright posture. Your ears, shoulders and hips should all line-up. °OFFICE WORK °When working at a desk, create an environment that supports good, upright posture. Without extra support, muscles fatigue and lead to excessive strain on joints and other tissues. °  CHAIR:  A chair should be able to slide under your desk when your back makes contact with the back of the chair. This allows you to work closely.  The chair's height should allow your eyes to be level with the upper part of your monitor and your hands to be slightly lower than your elbows.  Body position:  Your feet should make contact with the floor. If this is not possible, use a foot rest.  Keep your ears over your shoulders. This will reduce stress on your neck and low back. Document Released: 03/18/2005 Document Revised: 06/10/2011 Document Reviewed: 06/30/2008 [employer] Patient Information 2013 [place], [place].     Torticollis, Acute You have suddenly (acutely) developed a twisted  neck (torticollis). This is usually a self-limited condition. CAUSES  Acute torticollis may be caused by malposition, trauma or infection. Most commonly, acute torticollis is caused by sleeping in an awkward position. Torticollis may also be caused by the flexion, extension or twisting of the neck muscles beyond their normal position. Sometimes, the exact cause may not be known. SYMPTOMS  Usually, there is pain and limited movement of the neck. Your neck may twist to one side. DIAGNOSIS  The diagnosis is often made by physical examination. X-rays, CT scans or MRIs may be done if there is a history of trauma or concern of infection. TREATMENT  For a common, stiff neck that develops during sleep, treatment is focused on relaxing the contracted neck muscle. Medications (including shots) may be used to treat the problem. Most cases resolve in several days. Torticollis usually responds to conservative physical therapy. If left untreated, the shortened and spastic neck muscle can cause deformities in the face and neck. Rarely, surgery is required. HOME CARE INSTRUCTIONS   Use over-the-counter and prescription medications as directed by your caregiver.  Do stretching exercises and massage the neck as directed by your caregiver.  Follow up with physical therapy if needed and as directed by your caregiver. SEEK IMMEDIATE MEDICAL CARE IF:   You develop difficulty breathing or noisy breathing (stridor).  You drool, develop trouble swallowing or have pain with swallowing.  You develop numbness or weakness in the hands or feet.  You have changes in speech or vision.  You have problems with urination or bowel movements.  You have difficulty walking.  You have a fever.  You have increased pain. MAKE SURE YOU:   Understand these instructions.  Will watch your condition.  Will get help right away if you are not doing well or get worse. Document Released: 03/15/2000 Document Revised: 06/10/2011  Document Reviewed: 04/26/2009 [facility] Patient Information 2014 [place], [place].

## 2013-08-28 NOTE — ED Provider Notes (Signed)
Chief Complaint   Chief Complaint  Patient presents with  . Torticollis    History of Present Illness   Isaiah Morales is a 33 year old male who woke up this morning unable to turn his head to the right. It's wrong to the left. The patient states he tried to forcefully bend it to the right and became dizzy and lightheaded. He denies any injury to the neck. No radiation of pain down the arm, no numbness, tingling, or weakness. He denies any headache, fever, chest pain, or shortness of breath.  Review of Systems   Other than noted above, the patient denies any of the following symptoms: Constitutional:  No fever or chills. Neck:  No swelling, or adenopathy.   Cardiac:  No chest pain, tightness, or pressure. Respiratory:  No cough or dyspnea. M-S:  No joint pain, muscle pain, or back problems. Neuro:  No headache, muscle weakness, or paresthesias.  PMFSH   Past medical history, family history, social history, meds, and allergies were reviewed.    Physical Examination    Vital signs:  BP 116/81  Pulse 61  Temp(Src) 98 F (36.7 C) (Oral)  Resp 18  SpO2 100% General:  Alert, oriented he is holding his head to the left and unable to move it to the right. On one occasion an attempt to move it to the right causes severe pain and muscle spasms. Eye:  PERRL, full EOMs. ENT:  Pharynx clear, no oral lesions. Neck:  Both trapezius ridges were tender to palpation. His neck has ataxia 0 range of motion with severe pain and muscle spasm in all directions. Lungs:  No respiratory distress.  Breath sounds clear and equal bilaterally.  No wheezes, rales or rhonchi. Heart:  Regular rhythm.  No gallops, murmers, or rubs. Ext:  No upper extremity edema, pulses full.  Full ROM of joints with no joint or muscle pain to palpation. Neuro:  Alert and oriented times 3.  No focal muscle weakness.  DTRs symmetric.  Sensation intact to light touch. Skin: Clear, warm and dry.  No rash.  Good capillary  refill.  Course in [facility]   Given Zofran 8 mg sublingually, Dilaudid 1 mg IM, and lorazepam 1 mg IM.  Assessment   The encounter diagnosis was Torticollis.  Plan    1.  Meds:  The following meds were prescribed:   Discharge Medication List as of 08/28/2013 11:24 AM    START taking these medications   Details  cyclobenzaprine (FLEXERIL) 10 MG tablet Take 1 tablet (10 mg total) by mouth 3 (three) times daily as needed for muscle spasms., Starting 08/28/2013, Until Discontinued, Normal    LORazepam (ATIVAN) 1 MG tablet Take 1 tablet (1 mg total) by mouth every 8 (eight) hours as needed (for muscle spasm)., Starting 08/28/2013, Until Discontinued, Print    oxyCODONE-acetaminophen (PERCOCET) 5-325 MG per tablet 1 to 2 tablets every 6 hours as needed for pain., Print        2.  Patient Education/Counseling:  The patient was given appropriate handouts, self care instructions, and instructed in symptomatic relief.  Given neck exercises to start doing and suggested heat or cold.  3.  Follow up:  The patient was told to follow up here if no better in 3 to 4 days, or sooner if becoming worse in any way, and given some red flag symptoms such as worsening pain or new neurological symptoms which would prompt immediate return.  Return if no better in one week.  Reuben Likesavid C Louay Myrie, MD 08/28/13 [phone]

## 2013-08-28 NOTE — ED Notes (Signed)
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  Today woke up with a stiff neck to the right side of his neck Denies any injury

## 2013-09-01 ENCOUNTER — Encounter (HOSPITAL_COMMUNITY): Payer: Self-pay | Admitting: Emergency Medicine

## 2013-09-01 ENCOUNTER — Emergency Department (INDEPENDENT_AMBULATORY_CARE_PROVIDER_SITE_OTHER)
Admission: EM | Admit: 2013-09-01 | Discharge: 2013-09-01 | Disposition: A | Discharge status: Home / Self Care | Payer: BC Managed Care – PPO | Source: Home / Self Care

## 2013-09-01 DIAGNOSIS — X58XXXA Exposure to other specified factors, initial encounter: Secondary | ICD-10-CM

## 2013-09-01 DIAGNOSIS — S161XXA Strain of muscle, fascia and tendon at neck level, initial encounter: Secondary | ICD-10-CM

## 2013-09-01 DIAGNOSIS — S139XXA Sprain of joints and ligaments of unspecified parts of neck, initial encounter: Secondary | ICD-10-CM

## 2013-09-01 NOTE — ED Provider Notes (Signed)
CSN: [id number]     Arrival date & time 09/01/13  1039 History   First MD Initiated Contact with Patient 09/01/13 1056     Chief Complaint  Patient presents with  . Follow-up   (Consider location/radiation/quality/duration/timing/severity/associated sxs/prior Treatment) HPI Comments: 33 year old male is here for followup of neck muscle spasm 4 days ago. He was seen at the urgent care and given medications for pain relief. He states he feels much better, has no more spasms and has improved range of motion. He is unable to rotate his neck greater than 50 to the right. 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 He is requesting a couple of more days off of work. Denies numbness, focal weakness, or arm pain.   Past Medical History  Diagnosis Date  . Migraine    Past Surgical History  Procedure Laterality Date  . Knee surgery      right x 2   History reviewed. No pertinent family history. History  Substance Use Topics  . Smoking status: Never Smoker   . Smokeless tobacco: Not on file  . Alcohol Use: No    Review of Systems  Constitutional: Positive for activity change. Negative for fever, diaphoresis and fatigue.  HENT: Negative.   Respiratory: Negative.   Cardiovascular: Negative.   Gastrointestinal: Negative.   Musculoskeletal: Negative for back pain, joint swelling and neck stiffness.       As per history of present illness  Skin: Negative.     Allergies  Review of patient's allergies indicates no known allergies.  Home Medications   Prior to Admission medications   Medication Sig Start Date End Date Taking? Authorizing Provider  cyclobenzaprine (FLEXERIL) 10 MG tablet Take 1 tablet (10 mg total) by mouth 3 (three) times daily as needed for muscle spasms. 08/28/13   Reuben Likes, MD  ibuprofen (ADVIL,MOTRIN) 600 MG tablet Take 1 tablet (600 mg total) by mouth every 8 (eight) hours as needed for pain. 03/10/12   Adlih  Moreno-Coll, MD  LORazepam (ATIVAN) 1 MG tablet Take 1 tablet (1 mg total) by mouth every 8 (eight) hours as needed (for muscle spasm). 08/28/13   Reuben Likes, MD  oxyCODONE-acetaminophen (PERCOCET) 5-325 MG per tablet 1 to 2 tablets every 6 hours as needed for pain. 08/28/13   Reuben Likes, MD  SUMAtriptan (IMITREX) 50 MG tablet Take 1 tablet (50 mg total) by mouth every 2 (two) hours as needed for migraine. 04/27/12   Reuben Likes, MD   BP 131/88  Pulse 76  Temp(Src) 98.2 F (36.8 C) (Oral)  Resp 18  SpO2 100% Physical Exam  Nursing note and vitals reviewed. Constitutional: He is oriented to person, place, and time. He appears well-developed and well-nourished.  HENT:  Head: Normocephalic and atraumatic.  Eyes: EOM are normal.  Neck: Neck supple.  Range of motion to the left his complete, to the right 50, full flexion and extension. Minor tenderness to the right paracervical musculature, primarily the trapezius and scalene muscles. No swelling or discoloration.  Cardiovascular: Normal rate and regular rhythm.   Pulmonary/Chest: Effort normal. No respiratory distress.  Neurological: He is alert and oriented to person, place, and time. No cranial nerve deficit.  Skin: Skin is warm and dry.  Psychiatric: He has a normal mood and affect.    ED Course  Procedures (including critical care time) Labs Review Labs Reviewed - No data to display  Imaging Review No results found.  MDM   1. Neck muscle strain    Perform stretches as demonstrated, and heat. Reconstructions for improving muscle function and mobility. Heart no for 2 more days.   Hayden Rasmussenavid Rosalie Gelpi, NP 09/01/13 1220

## 2013-09-01 NOTE — ED Provider Notes (Signed)
Medical screening examination/treatment/procedure(s) were performed by non-physician practitioner and as supervising physician I was immediately available for consultation/collaboration.  Leslee Home, M.D.   Reuben Likes, MD 09/01/13 1344

## 2013-09-01 NOTE — Discharge Instructions (Signed)
Cervical Sprain A cervical sprain is when the tissues (ligaments) that hold the neck bones in place stretch or tear. HOME CARE   Put ice on the injured area.  Put ice in a plastic bag.  Place a towel between your skin and the bag.  Leave the ice on for 15 20 minutes, 3 4 times a day.  You may have been given a collar to wear. This collar keeps your neck from moving while you heal.  Do not take the collar off unless told by your doctor.  If you have long hair, keep it outside of the collar.  Ask your doctor before changing the position of your collar. You may need to change its position over time to make it more comfortable.  If you are allowed to take off the collar for cleaning or bathing, follow your doctor's instructions on how to do it safely.  Keep your collar clean by wiping it with mild soap and water. Dry it completely. If the collar has removable pads, remove them every 1 2 days to hand wash them with soap and water. Allow them to air dry. They should be dry before you wear them in the collar.  Do not drive while wearing the collar.  Only take medicine as told by your doctor.  Keep all doctor visits as told.  Keep all physical therapy visits as told.  Adjust your work station so that you have good posture while you work.  Avoid positions and activities that make your problems worse.  Warm up and stretch before being active. GET HELP IF:  Your pain is not controlled with medicine.  You cannot take less pain medicine over time as planned.  Your activity level does not improve as expected. GET HELP RIGHT AWAY IF:   You are bleeding.  Your stomach is upset.  You have an allergic reaction to your medicine.  You develop new problems that you cannot explain.  You lose feeling (become numb) or you cannot move any part of your body (paralysis).  You have tingling or weakness in any part of your body.  Your symptoms get worse. Symptoms include:  Pain,  soreness, stiffness, puffiness (swelling), or a burning feeling in your neck.  Pain when your neck is touched.  Shoulder or upper back pain.  Limited ability to move your neck.  Headache.  Dizziness.  Your hands or arms feel week, lose feeling, or tingle.  Muscle spasms.  Difficulty swallowing or chewing. MAKE SURE YOU:   Understand these instructions.  Will watch your condition.  Will get help right away if you are not doing well or get worse. Document Released: 09/04/2007 Document Revised: 11/18/2012 Document Reviewed: 09/23/2012 [facility] Patient Information 2014 [place], [place].  Muscle Strain A muscle strain is an injury that occurs when a muscle is stretched beyond its normal length. Usually a small number of muscle fibers are torn when this happens. Muscle strain is rated in degrees. First-degree strains have the least amount of muscle fiber tearing and pain. Second-degree and third-degree strains have increasingly more tearing and pain.  Usually, recovery from muscle strain takes 1 2 weeks. Complete healing takes 5 6 weeks.  CAUSES  Muscle strain happens when a sudden, violent force placed on a muscle stretches it too far. This may occur with lifting, sports, or a fall.  RISK FACTORS Muscle strain is especially common in athletes.  SIGNS AND SYMPTOMS At the site of the muscle strain, there may be:  Pain.  Bruising.  Swelling.  Difficulty using the muscle due to pain or lack of normal function. DIAGNOSIS  Your health care provider will perform a physical exam and ask about your medical history. TREATMENT  Often, the best treatment for a muscle strain is resting, icing, and applying cold compresses to the injured area.  HOME CARE INSTRUCTIONS   Use the PRICE method of treatment to promote muscle healing during the first 2 3 days after your injury. The PRICE method involves:  Protecting the muscle from being injured again.  Restricting your activity and  resting the injured body part.  STRETCHES AS DEMO'D AND HEAT NOW.  Apply compression to the injured area with a splint or elastic bandage. Be careful not to wrap it too tightly. This may interfere with blood circulation or increase swelling.  Elevate the injured body part above the level of your heart as often as you can.  Only take over-the-counter or prescription medicines for pain, discomfort, or fever as directed by your health care provider.  Warming up prior to exercise helps to prevent future muscle strains. SEEK MEDICAL CARE IF:   You have increasing pain or swelling in the injured area.  You have numbness, tingling, or a significant loss of strength in the injured area. MAKE SURE YOU:   Understand these instructions.  Will watch your condition.  Will get help right away if you are not doing well or get worse. Document Released: 03/18/2005 Document Revised: 01/06/2013 Document Reviewed: 10/15/2012 [facility] Patient Information 2014 [place], [place].

## 2013-09-01 NOTE — ED Notes (Signed)
C/o continued pain in neck despite reportedly following treatment plan from 5-30 [facility] visit. NAD, wants work note extended

## 2013-10-28 ENCOUNTER — Ambulatory Visit (INDEPENDENT_AMBULATORY_CARE_PROVIDER_SITE_OTHER): Payer: BC Managed Care – PPO | Admitting: Emergency Medicine

## 2013-10-28 VITALS — BP 118/70 | HR 63 | Temp 98.0°F | Resp 16 | Ht 68.75 in | Wt 180.8 lb

## 2013-10-28 DIAGNOSIS — Z0000 Encounter for general adult medical examination without abnormal findings: Secondary | ICD-10-CM

## 2013-10-28 LAB — POCT CBC
GRANULOCYTE PERCENT: 55.8 % (ref 37–80)
HCT, POC: 44.3 % (ref 43.5–53.7)
Hemoglobin: 14.5 g/dL (ref 14.1–18.1)
Lymph, poc: 2.5 (ref 0.6–3.4)
MCH, POC: 27.8 pg (ref 27–31.2)
MCHC: 32.7 g/dL (ref 31.8–35.4)
MCV: 85.1 fL (ref 80–97)
MID (CBC): 0.2 (ref 0–0.9)
MPV: 9.5 fL (ref 0–99.8)
PLATELET COUNT, POC: 115 10*3/uL — AB (ref 142–424)
POC Granulocyte: 3.5 (ref 2–6.9)
POC LYMPH PERCENT: 40.4 %L (ref 10–50)
POC MID %: 3.8 %M (ref 0–12)
RBC: 5.21 M/uL (ref 4.69–6.13)
RDW, POC: 15.1 %
WBC: 6.2 10*3/uL (ref 4.6–10.2)

## 2013-10-28 LAB — LIPID PANEL
CHOL/HDL RATIO: 3.1 ratio
Cholesterol: 147 mg/dL (ref 0–200)
HDL: 47 mg/dL (ref >39)
LDL Cholesterol: 87 mg/dL (ref 0–99)
TRIGLYCERIDES: 65 mg/dL (ref <150)
VLDL: 13 mg/dL (ref 0–40)

## 2013-10-28 LAB — POCT URINALYSIS DIPSTICK
Bilirubin, UA: NEGATIVE
Blood, UA: NEGATIVE
GLUCOSE UA: NEGATIVE
Ketones, UA: NEGATIVE
LEUKOCYTES UA: NEGATIVE
NITRITE UA: NEGATIVE
Protein, UA: NEGATIVE
SPEC GRAV UA: 1.015
UROBILINOGEN UA: 1
pH, UA: 8.5

## 2013-10-28 LAB — COMPREHENSIVE METABOLIC PANEL
ALBUMIN: 4.4 g/dL (ref 3.5–5.2)
ALK PHOS: 27 U/L — AB (ref 39–117)
ALT: 18 U/L (ref 0–53)
AST: 18 U/L (ref 0–37)
BUN: 11 mg/dL (ref 6–23)
CO2: 24 mEq/L (ref 19–32)
Calcium: 9.3 mg/dL (ref 8.4–10.5)
Chloride: 104 mEq/L (ref 96–112)
Creat: 0.87 mg/dL (ref 0.50–1.35)
Glucose, Bld: 93 mg/dL (ref 70–99)
POTASSIUM: 4.1 meq/L (ref 3.5–5.3)
SODIUM: 137 meq/L (ref 135–145)
TOTAL PROTEIN: 6.8 g/dL (ref 6.0–8.3)
Total Bilirubin: 0.5 mg/dL (ref 0.2–1.2)

## 2013-10-28 MED ORDER — SUMATRIPTAN SUCCINATE 50 MG PO TABS: 50.0000 mg | ORAL_TABLET | ORAL | Status: DC | PRN

## 2013-10-28 NOTE — Progress Notes (Signed)
[facility] [address], [place] [address] [phone]  Date:  10/28/2013   Name:  Isaiah Morales   DOB:  11-Feb-1981   MRN:  [id number]  PCP:  Default, Provider, MD    Chief Complaint: Annual Exam and Medication Refill   History of Present Illness:  Isaiah Morales is a 33 y.o. very pleasant male patient who presents with the following:  For wellness examination for his employer.  History of migraines and uses imitrex successfully for treatment has no adverse effect of treatment.  Developed a typical migraine in office. Denies other complaint or health concern today.   There are no active problems to display for this patient.   Past Medical History  Diagnosis Date  . Migraine     Past Surgical History  Procedure Laterality Date  . Knee surgery      right x 2    History  Substance Use Topics  . Smoking status: Never Smoker   . Smokeless tobacco: Not on file  . Alcohol Use: No    History reviewed. No pertinent family history.  No Known Allergies  Medication list has been reviewed and updated.  Current Outpatient Prescriptions on File Prior to Visit  Medication Sig Dispense Refill  . SUMAtriptan (IMITREX) 50 MG tablet Take 1 tablet (50 mg total) by mouth every 2 (two) hours as needed for migraine.  10 tablet  0  . cyclobenzaprine (FLEXERIL) 10 MG tablet Take 1 tablet (10 mg total) by mouth 3 (three) times daily as needed for muscle spasms.  30 tablet  0  . ibuprofen (ADVIL,MOTRIN) 600 MG tablet Take 1 tablet (600 mg total) by mouth every 8 (eight) hours as needed for pain.  20 tablet  0  . LORazepam (ATIVAN) 1 MG tablet Take 1 tablet (1 mg total) by mouth every 8 (eight) hours as needed (for muscle spasm).  15 tablet  0  . oxyCODONE-acetaminophen (PERCOCET) 5-325 MG per tablet 1 to 2 tablets every 6 hours as needed for pain.  20 tablet  0   No current facility-administered medications on file prior to visit.    Review of Systems:  As  per HPI, otherwise negative.    Physical Examination: Filed Vitals:   10/28/13 1552  BP: 118/70  Pulse: 63  Temp: 98 F (36.7 C)  Resp: 16   Filed Vitals:   10/28/13 1552  Height: 5' 8.75" (1.746 m)  Weight: 180 lb 12.8 oz (82.01 kg)   Body mass index is 26.9 kg/(m^2). Ideal Body Weight: Weight in (lb) to have BMI = 25: 167.7  GEN: WDWN, NAD, Non-toxic, A & O x 3 HEENT: Atraumatic, Normocephalic. Neck supple. No masses, No LAD. Ears and Nose: No external deformity. CV: RRR, No M/G/R. No JVD. No thrill. No extra heart sounds. PULM: CTA B, no wheezes, crackles, rhonchi. No retractions. No resp. distress. No accessory muscle use. ABD: S, NT, ND, +BS. No rebound. No HSM. EXTR: No c/c/e NEURO Normal gait.  PSYCH: Normally interactive. Conversant. Not depressed or anxious appearing.  Calm demeanor.    Assessment and Plan: Wellness examination Labs pending  Signed,  Phillips OdorJeffery Anderson, MD

## 2013-10-29 LAB — TSH: TSH: 1.619 u[IU]/mL (ref 0.350–4.500)

## 2013-11-07 ENCOUNTER — Emergency Department (HOSPITAL_COMMUNITY)
Admission: EM | Admit: 2013-11-07 | Discharge: 2013-11-07 | Disposition: A | Discharge status: Home / Self Care | Payer: BC Managed Care – PPO | Source: Home / Self Care | Attending: Family Medicine | Admitting: Family Medicine

## 2013-11-07 ENCOUNTER — Encounter (HOSPITAL_COMMUNITY): Payer: Self-pay | Admitting: Emergency Medicine

## 2013-11-07 DIAGNOSIS — G43909 Migraine, unspecified, not intractable, without status migrainosus: Secondary | ICD-10-CM

## 2013-11-07 MED ORDER — DEXAMETHASONE SODIUM PHOSPHATE 10 MG/ML IJ SOLN
INTRAMUSCULAR | Status: AC
  Filled 2013-11-07: qty 1

## 2013-11-07 MED ORDER — METOCLOPRAMIDE HCL 5 MG/ML IJ SOLN
INTRAMUSCULAR | Status: AC
  Filled 2013-11-07: qty 2

## 2013-11-07 MED ORDER — DEXAMETHASONE SODIUM PHOSPHATE 10 MG/ML IJ SOLN
10.0000 mg | Freq: Once | INTRAMUSCULAR | Status: AC
  Administered 2013-11-07: 10 mg via INTRAMUSCULAR

## 2013-11-07 MED ORDER — METOCLOPRAMIDE HCL 5 MG/ML IJ SOLN
5.0000 mg | Freq: Once | INTRAMUSCULAR | Status: AC
  Administered 2013-11-07: 5 mg via INTRAMUSCULAR

## 2013-11-07 MED ORDER — KETOROLAC TROMETHAMINE 60 MG/2ML IM SOLN
INTRAMUSCULAR | Status: AC
  Filled 2013-11-07: qty 2

## 2013-11-07 MED ORDER — KETOROLAC TROMETHAMINE 60 MG/2ML IM SOLN
60.0000 mg | Freq: Once | INTRAMUSCULAR | Status: AC
  Administered 2013-11-07: 60 mg via INTRAMUSCULAR

## 2013-11-07 NOTE — Discharge Instructions (Signed)
Thank you for coming in today. Come back as needed.  Make an appointment with the neurologists about your headaches.  Go to the emergency room if your headache becomes excruciating or you have weakness or numbness or uncontrolled vomiting.    Migraine Headache A migraine headache is an intense, throbbing pain on one or both sides of your head. A migraine can last for 30 minutes to several hours. CAUSES  The exact cause of a migraine headache is not always known. However, a migraine may be caused when nerves in the brain become irritated and release chemicals that cause inflammation. This causes pain. Certain things may also trigger migraines, such as:  Alcohol.  Smoking.  Stress.  Menstruation.  Aged cheeses.  Foods or drinks that contain nitrates, glutamate, aspartame, or tyramine.  Lack of sleep.  Chocolate.  Caffeine.  Hunger.  Physical exertion.  Fatigue.  Medicines used to treat chest pain (nitroglycerine), birth control pills, estrogen, and some blood pressure medicines. SIGNS AND SYMPTOMS  Pain on one or both sides of your head.  Pulsating or throbbing pain.  Severe pain that prevents daily activities.  Pain that is aggravated by any physical activity.  Nausea, vomiting, or both.  Dizziness.  Pain with exposure to bright lights, loud noises, or activity.  General sensitivity to bright lights, loud noises, or smells. Before you get a migraine, you may get warning signs that a migraine is coming (aura). An aura may include:  Seeing flashing lights.  Seeing bright spots, halos, or zigzag lines.  Having tunnel vision or blurred vision.  Having feelings of numbness or tingling.  Having trouble talking.  Having muscle weakness. DIAGNOSIS  A migraine headache is often diagnosed based on:  Symptoms.  Physical exam.  A CT scan or MRI of your head. These imaging tests cannot diagnose migraines, but they can help rule out other causes of  headaches. TREATMENT Medicines may be given for pain and nausea. Medicines can also be given to help prevent recurrent migraines.  HOME CARE INSTRUCTIONS  Only take over-the-counter or prescription medicines for pain or discomfort as directed by your health care provider. The use of long-term narcotics is not recommended.  Lie down in a dark, quiet room when you have a migraine.  Keep a journal to find out what may trigger your migraine headaches. For example, write down:  What you eat and drink.  How much sleep you get.  Any change to your diet or medicines.  Limit alcohol consumption.  Quit smoking if you smoke.  Get 7-9 hours of sleep, or as recommended by your health care provider.  Limit stress.  Keep lights dim if bright lights bother you and make your migraines worse. SEEK IMMEDIATE MEDICAL CARE IF:   Your migraine becomes severe.  You have a fever.  You have a stiff neck.  You have vision loss.  You have muscular weakness or loss of muscle control.  You start losing your balance or have trouble walking.  You feel faint or pass out.  You have severe symptoms that are different from your first symptoms. MAKE SURE YOU:   Understand these instructions.  Will watch your condition.  Will get help right away if you are not doing well or get worse. Document Released: 03/18/2005 Document Revised: 08/02/2013 Document Reviewed: 11/23/2012 [facility] Patient Information 2015 [place], [place]. This information is not intended to replace advice given to you by your health care provider. Make sure you discuss any questions you have with your  health care provider. ° °

## 2013-11-07 NOTE — ED Notes (Signed)
Patient complains of frequent migraines. This episode started today about 3 hours ago. Patient reports he takes Imitrex for migraines but is currently having more than the usual pain. Patient reports pain in the back of his neck. Patient is alert and oriented and in no acute distress.

## 2013-11-07 NOTE — ED Provider Notes (Signed)
Isaiah Morales is a 33 y.o. male who presents to [facility]gent Care today for migraine. Patient developed a migraine headache today about 4 hours ago. The pain is bilateral posterior and c/w prior migraine. No weakness or numbness fever, chills, NVD. He took Imitrex twice which has not helped. He feels well otherwise.    Past Medical History  Diagnosis Date  . Migraine    History  Substance Use Topics  . Smoking status: Never Smoker   . Smokeless tobacco: Not on file  . Alcohol Use: No   ROS as above Medications: Current Facility-Administered Medications  Medication Dose Route Frequency Provider Last Rate Last Dose  . dexamethasone (DECADRON) injection 10 mg  10 mg Intramuscular Once Rodolph BongEvan S Ramal Eckhardt, MD      . ketorolac (TORADOL) injection 60 mg  60 mg Intramuscular Once Rodolph BongEvan S Jearldine Cassady, MD      . metoCLOPramide (REGLAN) injection 5 mg  5 mg Intramuscular Once Rodolph BongEvan S Climmie Cronce, MD       Current Outpatient Prescriptions  Medication Sig Dispense Refill  . cyclobenzaprine (FLEXERIL) 10 MG tablet Take 1 tablet (10 mg total) by mouth 3 (three) times daily as needed for muscle spasms.  30 tablet  0  . ibuprofen (ADVIL,MOTRIN) 600 MG tablet Take 1 tablet (600 mg total) by mouth every 8 (eight) hours as needed for pain.  20 tablet  0  . LORazepam (ATIVAN) 1 MG tablet Take 1 tablet (1 mg total) by mouth every 8 (eight) hours as needed (for muscle spasm).  15 tablet  0  . oxyCODONE-acetaminophen (PERCOCET) 5-325 MG per tablet 1 to 2 tablets every 6 hours as needed for pain.  20 tablet  0  . SUMAtriptan (IMITREX) 50 MG tablet Take 1 tablet (50 mg total) by mouth every 2 (two) hours as needed for migraine.  10 tablet  12    Exam:  BP 124/62  Pulse 69  Temp(Src) 98.6 F (37 C) (Oral)  Resp 18  SpO2 99% Gen: Well NAD HEENT: EOMI,  MMM, PERRLA Lungs: Normal work of breathing. CTABL Heart: RRR no MRG Abd: NABS, Soft. Nondistended, Nontender Exts: Brisk capillary refill, warm and well perfused.  Neuro: AOx3  CN2-12 intact. Normal coordination, reflex sensation, balance and strength.   Pt was given IM Reglan, Toradol, and dexamethasone and felt somewhat better.  No results found for this or any previous visit (from the past 24 hour(s)). No results found.  Assessment and Plan: 33 y.o. male with migraine. Tx as above. F/u with neurology.   Discussed warning signs or symptoms. Please see discharge instructions. Patient expresses understanding.   This note was created using Conservation officer, historic buildingsDragon voice recognition software. Any transcription errors are unintended.    Rodolph BongEvan S Ciara Kagan, MD 11/07/13 [phone]

## 2013-11-16 ENCOUNTER — Ambulatory Visit (INDEPENDENT_AMBULATORY_CARE_PROVIDER_SITE_OTHER): Payer: BC Managed Care – PPO | Admitting: Neurology

## 2013-11-16 ENCOUNTER — Other Ambulatory Visit: Payer: Self-pay | Admitting: *Deleted

## 2013-11-16 ENCOUNTER — Encounter: Payer: Self-pay | Admitting: Neurology

## 2013-11-16 VITALS — BP 106/80 | HR 68 | Temp 97.8°F | Resp 16 | Ht 70.0 in | Wt 184.3 lb

## 2013-11-16 DIAGNOSIS — G43109 Migraine with aura, not intractable, without status migrainosus: Secondary | ICD-10-CM

## 2013-11-16 MED ORDER — SUMATRIPTAN SUCCINATE 100 MG PO TABS: 100.0000 mg | ORAL_TABLET | Freq: Once | ORAL | Status: DC | PRN

## 2013-11-16 MED ORDER — TOPIRAMATE 25 MG PO TABS: 25.0000 mg | ORAL_TABLET | Freq: Two times a day (BID) | ORAL | Status: DC

## 2013-11-16 NOTE — Progress Notes (Addendum)
NEUROLOGY CONSULTATION NOTE  Isaiah Morales MRN: [id number] DOB: 1981/02/20  Referring provider: Clementeen GrahamEvan Corey, MD (ED) Primary care provider: Thornton PapasJeffrey Anderson, MD  Reason for consult:  Migraine  HISTORY OF PRESENT ILLNESS: Isaiah Morales is a 33 year old right-handed man who presents for migraine.  Records reviewed.  Onset:  College Location:  Moderate:  Bifrontal for moderate; Severe: back of the neck/suboccipital region   Quality:  Moderate:  Pressure; Severe: squeezing Intensity:  Moderate:  5/10; Severe:  10/10 Aura:  Scotomas Prodrome:  no Associated symptoms:  Photophobia, phonophobia.  Nausea once. For severe headaches, slurred speech. Duration:  Moderate:  1-3 days w/o meds; Severe:  Ongoing until treated Frequency:  5 to 7 days a month on average (severe headaches twice a year) Triggers/exacerbating factors:  Humidity (when about to rain), frozen dinners (nitrates), olive oil, sometimes cigarette smoke Relieving factors:  Cold compress, sometimes goes for a run, laying in the dark Activity:  Able to function except for the severe ones.  Needs to leave work three times a month because he fears it may become severe.  Past abortive therapy:  Ibuprofen 800mg  (helps but overused it), Aleve (variable), Tylenol (variables), Goody's powder (helps) Past preventative therapy:  no  Current abortive therapy:  sumatriptan 50mg  (variable) Current preventative therapy:  no  Caffeine:  Red Bull, Mountain Dew Alcohol:  Rarely Smoker:  No Diet:  organic Exercise:  regularly Depression/stress:  Stress due to work (works 3 jobs, drives a Advertising copywritercherry picker.  Feels he is not advancing), student loan debt Sleep hygiene:  26.6 hours per week due to 3 jobs Family history of headache:  No No family history of aneurysms No history of head injuries  PAST MEDICAL HISTORY: Past Medical History  Diagnosis Date  . Migraine     PAST SURGICAL HISTORY: Past Surgical History  Procedure  Laterality Date  . Knee surgery      right x 2    MEDICATIONS: No current outpatient prescriptions on file prior to visit.   No current facility-administered medications on file prior to visit.    ALLERGIES: No Known Allergies  FAMILY HISTORY: No family history on file.  SOCIAL HISTORY: History   Social History  . Marital Status: Single    Spouse Name: N/A    Number of Children: N/A  . Years of Education: N/A   Occupational History  . Not on file.   Social History Main Topics  . Smoking status: Never Smoker   . Smokeless tobacco: Not on file  . Alcohol Use: No  . Drug Use: No  . Sexual Activity: No   Other Topics Concern  . Not on file   Social History Narrative  . No narrative on file    REVIEW OF SYSTEMS: Constitutional: No fevers, chills, or sweats, no generalized fatigue, change in appetite Eyes: No visual changes, double vision, eye pain Ear, nose and throat: No hearing loss, ear pain, nasal congestion, sore throat Cardiovascular: No chest pain, palpitations Respiratory:  No shortness of breath at rest or with exertion, wheezes GastrointestinaI: No nausea, vomiting, diarrhea, abdominal pain, fecal incontinence Genitourinary:  No dysuria, urinary retention or frequency Musculoskeletal:  No neck pain, back pain Integumentary: No rash, pruritus, skin lesions Neurological: as above Psychiatric: No depression, insomnia, anxiety Endocrine: No palpitations, fatigue, diaphoresis, mood swings, change in appetite, change in weight, increased thirst Hematologic/Lymphatic:  No anemia, purpura, petechiae. Allergic/Immunologic: no itchy/runny eyes, nasal congestion, recent allergic reactions, rashes  PHYSICAL EXAM: Filed Vitals:  11/16/13 0837  BP: 106/80  Pulse: 68  Temp: 97.8 F (36.6 C)  Resp: 16   General: No acute distress Head:  Normocephalic/atraumatic Neck: supple, no paraspinal tenderness, full range of motion Back: No paraspinal  tenderness Heart: regular rate and rhythm Lungs: Clear to auscultation bilaterally. Vascular: No carotid bruits. Neurological Exam: Mental status: alert and oriented to person, place, and time, recent and remote memory intact, fund of knowledge intact, attention and concentration intact, speech fluent and not dysarthric, language intact. Cranial nerves: CN I: not tested CN II: pupils equal, round and reactive to light, visual fields intact, fundi unremarkable, without vessel changes, exudates, hemorrhages or papilledema. CN III, IV, VI:  full range of motion, no nystagmus, no ptosis CN V: facial sensation intact CN VII: upper and lower face symmetric CN VIII: hearing intact CN IX, X: gag intact, uvula midline CN XI: sternocleidomastoid and trapezius muscles intact CN XII: tongue midline Bulk & Tone: normal, no fasciculations. Motor: 5 out of 5 throughout Sensation: Temperature and vibration intact Deep Tendon Reflexes: 1+ throughout, toes downgoing Finger to nose testing: No dysmetria Heel to shin: No dysmetria Gait: Normal station and stride. Able to turn and walk in tandem. Romberg negative.  IMPRESSION: Migraine with aura.   Since his exam is normal and he has had these headaches for 10 years, I wouldn't pursue MRI at this time.  We will try pharmacologic management first.  PLAN: 1. Start Topamax 25 mg at bedtime. Side effects discussed. Instructed to call in 4 weeks with update and adjustments can be made if needed. 2. For abortive therapy, will prescribe sumatriptan 100 mg, instructed to take along with naproxen 500 mg. 3. Discontinue caffeine, including red bull and Mountain Dew 4. Followup in 3 months  Thank you for allowing me to take part in the care of this patient.  Shon Millet, DO  CC: Thornton Papas, MD

## 2013-11-16 NOTE — Patient Instructions (Signed)
1.  We will start topiramate (Topamax) 25mg  at bedtime.  Possible side effects include: impaired thinking, sedation, paresthesias (numbness and tingling) and weight loss.  It may cause dehydration and there is a small risk for kidney stones, so make sure to stay hydrated with water during the day.  There is also a very small risk for glaucoma, so if you notice any change in your vision while taking this medication, see an ophthalmologist. 2.  When you get the migraine, take sumatriptan 100mg  along with Naproxen sodium (Aleve) 500mg .  May repeat once in 2 hours if needed. 3.  Stop Red Bull and [employer] 4.  Call in 4 weeks with update and we can adjust dose of topamax if needed.  Follow up in 3 months.

## 2013-11-19 ENCOUNTER — Emergency Department (HOSPITAL_COMMUNITY)
Admission: EM | Admit: 2013-11-19 | Discharge: 2013-11-19 | Disposition: A | Discharge status: Home / Self Care | Payer: BC Managed Care – PPO | Attending: Emergency Medicine | Admitting: Emergency Medicine

## 2013-11-19 ENCOUNTER — Encounter (HOSPITAL_COMMUNITY): Payer: Self-pay | Admitting: Emergency Medicine

## 2013-11-19 DIAGNOSIS — G43909 Migraine, unspecified, not intractable, without status migrainosus: Secondary | ICD-10-CM | POA: Insufficient documentation

## 2013-11-19 DIAGNOSIS — Z79899 Other long term (current) drug therapy: Secondary | ICD-10-CM | POA: Diagnosis not present

## 2013-11-19 DIAGNOSIS — R51 Headache: Secondary | ICD-10-CM | POA: Insufficient documentation

## 2013-11-19 DIAGNOSIS — Z791 Long term (current) use of non-steroidal anti-inflammatories (NSAID): Secondary | ICD-10-CM | POA: Diagnosis not present

## 2013-11-19 DIAGNOSIS — R519 Headache, unspecified: Secondary | ICD-10-CM

## 2013-11-19 MED ORDER — KETOROLAC TROMETHAMINE 30 MG/ML IJ SOLN
30.0000 mg | Freq: Once | INTRAMUSCULAR | Status: DC
  Filled 2013-11-19: qty 1

## 2013-11-19 MED ORDER — METOCLOPRAMIDE HCL 5 MG/ML IJ SOLN
10.0000 mg | INTRAMUSCULAR | Status: DC
  Filled 2013-11-19: qty 2

## 2013-11-19 MED ORDER — NAPROXEN 500 MG PO TABS: 500.0000 mg | ORAL_TABLET | Freq: Two times a day (BID) | ORAL | Status: DC

## 2013-11-19 MED ORDER — DIPHENHYDRAMINE HCL 50 MG/ML IJ SOLN
25.0000 mg | Freq: Once | INTRAMUSCULAR | Status: DC
  Filled 2013-11-19: qty 1

## 2013-11-19 NOTE — ED Notes (Signed)
Patient states pain medication he took around 0100 this morning may be starting to kick in and pain is becoming more tolerable.  Patient states he does not want to take any of the medications by IV right now because the pain is tolerable and his bill was $1500 last time he was seen for this in the ED.  Will hold medications for now.

## 2013-11-19 NOTE — Discharge Instructions (Signed)

## 2013-11-19 NOTE — ED Notes (Signed)
Patient states he wants to go home.  PA notified to speak with patient.

## 2013-11-19 NOTE — ED Notes (Signed)
Pt presents with hx of migraines, states he has been having one all day today sensitive to light and sound.  Pt was prescribed Topiramate for migraines which he started today, states "it is not helping".  Pt alert and oriented X 4, neuro exam negative.

## 2013-11-22 NOTE — ED Provider Notes (Signed)
CSN: [id number]     Arrival date & time 11/19/13  0230 History   First MD Initiated Contact with Patient 11/19/13 [phone]     Chief Complaint  Patient presents with  . Headache    (Consider location/radiation/quality/duration/timing/severity/associated sxs/prior Treatment) HPI Comments: Patient is a 33 year old male with a history of migraine headaches who presents to the emergency department for a headache which began yesterday morning. Patient states the pain has been constant since onset. Patient has been gradually improving over the last few hours. Patient states pain has gone from 10/10 to 4/10. He states he took Topamax for his headache PTA. Symptoms associated with photophobia and phonophobia. Headache today is consistent with prior headaches. Patient denies fever, neck stiffness, vision loss, hearing loss, syncope, vomiting, and head injury or trauma. Patient states he is no longer desiring treatment in the emergency department. He states he wishes to be discharged to followup with Urgent Care in the AM as needed because he cannot afford an ED bill.  The history is provided by the patient. No language interpreter was used.    Past Medical History  Diagnosis Date  . Migraine    Past Surgical History  Procedure Laterality Date  . Knee surgery      right x 2   No family history on file. History  Substance Use Topics  . Smoking status: Never Smoker   . Smokeless tobacco: Not on file  . Alcohol Use: No    Review of Systems  Constitutional: Negative for fever.  HENT: Negative for trouble swallowing.        +phonophobia  Eyes: Positive for photophobia.  Gastrointestinal: Negative for vomiting.  Musculoskeletal: Negative for neck stiffness.  Neurological: Positive for headaches. Negative for syncope.  All other systems reviewed and are negative.    Allergies  Review of patient's allergies indicates no known allergies.  Home Medications   Prior to Admission medications    Medication Sig Start Date End Date Taking? Authorizing Provider  naproxen (NAPROSYN) 500 MG tablet Take 1 tablet (500 mg total) by mouth 2 (two) times daily. 11/19/13   Antony Madura, PA-C  SUMAtriptan (IMITREX) 100 MG tablet Take 1 tablet (100 mg total) by mouth once as needed for migraine or headache. May repeat once in 2 hours if headache persists or recurs.  Do not exceed 2 pills in 24hours. 11/16/13   Adam Gus Rankin, DO  topiramate (TOPAMAX) 25 MG tablet Take 1 tablet (25 mg total) by mouth 2 (two) times daily. 11/16/13   Adam Gus Rankin, DO   BP 127/83  Pulse 61  Resp 18  Ht  (1.778 m)  Wt 184 lb (83.462 kg)  BMI 26.40 kg/m2  SpO2 100%  Physical Exam  Nursing note and vitals reviewed. Constitutional: He is oriented to person, place, and time. He appears well-developed and well-nourished. No distress.  Nontoxic/nonseptic appearing  HENT:  Head: Normocephalic and atraumatic.  Mouth/Throat: Oropharynx is clear and moist. No oropharyngeal exudate.  Symmetric rise of the uvula with phonation.  Eyes: Conjunctivae and EOM are normal. Pupils are equal, round, and reactive to light. No scleral icterus.  Pupils equal round and reactive to direct and consensual light  Neck: Normal range of motion. Neck supple.  No nuchal rigidity or meningismus  Cardiovascular: Normal rate, regular rhythm and intact distal pulses.   Pulmonary/Chest: Effort normal. No respiratory distress.  Chest expansion symmetric  Musculoskeletal: Normal range of motion.  Neurological: He is alert and oriented to person, place,  and time. No cranial nerve deficit. He exhibits normal muscle tone. Coordination normal.  GCS 15. Speech is goal oriented. No cranial nerve deficits appreciated; symmetric eyebrow raise, no facial drooping, tongue midline. Patient moves extremities without ataxia. Equal grip strength and 5/5 strength against resistance in bilateral extremities appreciated. No gross sensory deficits noted.  Patient ambulates with normal gait.  Skin: Skin is warm and dry. No rash noted. He is not diaphoretic. No erythema. No pallor.  Psychiatric: He has a normal mood and affect. His behavior is normal.    ED Course  Procedures (including critical care time) Labs Review Labs Reviewed - No data to display  Imaging Review No results found.   EKG Interpretation None      MDM   Final diagnoses:  Nonintractable headache, unspecified chronicity pattern, unspecified headache type    33 year old male presents to the emergency department for headache consistent with prior headaches. Patient took Topamax prior to arrival. He states he is now feeling much improved; pain rated 4/10 at present. No focal neurologic deficits appreciated on exam. No nuchal rigidity or meningismus to suggest meningitis. Given history of similar headaches, doubt emergent intracranial process. Patient has not desiring treatment in the emergency department at this time secondary to cost. He is stable and appropriate for discharge with instruction followup with his primary care provider as needed. Also recommended followup with his neurologist. Patient discharged in good condition.   Filed Vitals:   11/19/13 0245 11/19/13 0415 11/19/13 0430 11/19/13 0445  BP: 146/56 123/88 127/76 127/83  Pulse: 70 57 52 61  Resp: 18     Height:  (1.778 m)     Weight: 184 lb (83.462 kg)     SpO2: 98% 99% 100% 100%     Antony Madura, PA-C 11/26/13 1500

## 2013-11-26 NOTE — ED Provider Notes (Signed)
Medical screening examination/treatment/procedure(s) were performed by non-physician practitioner and as supervising physician I was immediately available for consultation/collaboration.   EKG Interpretation None        Enid Skeens, MD 11/26/13 2212

## 2013-12-10 ENCOUNTER — Encounter (HOSPITAL_COMMUNITY): Payer: Self-pay | Admitting: Emergency Medicine

## 2013-12-10 ENCOUNTER — Emergency Department (INDEPENDENT_AMBULATORY_CARE_PROVIDER_SITE_OTHER)
Admission: EM | Admit: 2013-12-10 | Discharge: 2013-12-10 | Disposition: A | Discharge status: Home / Self Care | Payer: BC Managed Care – PPO | Source: Home / Self Care | Attending: Family Medicine | Admitting: Family Medicine

## 2013-12-10 DIAGNOSIS — H9209 Otalgia, unspecified ear: Secondary | ICD-10-CM

## 2013-12-10 DIAGNOSIS — H9201 Otalgia, right ear: Secondary | ICD-10-CM

## 2013-12-10 DIAGNOSIS — J039 Acute tonsillitis, unspecified: Secondary | ICD-10-CM

## 2013-12-10 LAB — POCT RAPID STREP A: Streptococcus, Group A Screen (Direct): NEGATIVE

## 2013-12-10 MED ORDER — AMOXICILLIN 875 MG PO TABS: 875.0000 mg | ORAL_TABLET | Freq: Two times a day (BID) | ORAL | Status: DC

## 2013-12-10 MED ORDER — PSEUDOEPHEDRINE-IBUPROFEN 30-200 MG PO TABS: ORAL_TABLET | ORAL | Status: DC

## 2013-12-10 MED ORDER — METHYLPREDNISOLONE 4 MG PO KIT: PACK | ORAL | Status: DC

## 2013-12-10 NOTE — Discharge Instructions (Signed)

## 2013-12-10 NOTE — ED Provider Notes (Signed)
CSN: [id number]     Arrival date & time 12/10/13  1109 History   First MD Initiated Contact with Patient 12/10/13 1132     Chief Complaint  Patient presents with  . Sore Throat   (Consider location/radiation/quality/duration/timing/severity/associated sxs/prior Treatment) HPI Comments: 33 year old male presents complaining of sore throat for 3 days. Pain is exacerbated by swallowing and by coughing. The pain is located in the right side of his throat. He has minimal pain in the past when he is not coughing or swallowing. He also admits to mild right ear pain. He denies any systemic symptoms such as fever, chills, NVD. No recent travel or sick contacts.   Past Medical History  Diagnosis Date  . Migraine    Past Surgical History  Procedure Laterality Date  . Knee surgery      right x 2   No family history on file. History  Substance Use Topics  . Smoking status: Never Smoker   . Smokeless tobacco: Not on file  . Alcohol Use: No    Review of Systems  Constitutional: Positive for fatigue. Negative for fever and chills.  HENT: Positive for sore throat and trouble swallowing. Negative for congestion, ear pain and voice change.   Respiratory: Positive for cough.   All other systems reviewed and are negative.   Allergies  Review of patient's allergies indicates no known allergies.  Home Medications   Prior to Admission medications   Medication Sig Start Date End Date Taking? Authorizing Provider  amoxicillin (AMOXIL) 875 MG tablet Take 1 tablet (875 mg total) by mouth 2 (two) times daily. 12/10/13   Graylon Good, PA-C  methylPREDNISolone (MEDROL DOSEPAK) 4 MG tablet Use as directed on package instructions 12/10/13   Graylon Good, PA-C  naproxen (NAPROSYN) 500 MG tablet Take 1 tablet (500 mg total) by mouth 2 (two) times daily. 11/19/13   Antony Madura, PA-C  Pseudoephedrine-Ibuprofen 30-200 MG TABS 1-2 tabs PO Q6 hrs PRN 12/10/13   Graylon Good, PA-C  SUMAtriptan (IMITREX)  100 MG tablet Take 1 tablet (100 mg total) by mouth once as needed for migraine or headache. May repeat once in 2 hours if headache persists or recurs.  Do not exceed 2 pills in 24hours. 11/16/13   Adam Gus Rankin, DO  topiramate (TOPAMAX) 25 MG tablet Take 1 tablet (25 mg total) by mouth 2 (two) times daily. 11/16/13   Adam Gus Rankin, DO   BP 116/69  Pulse 70  Temp(Src) 98.1 F (36.7 C) (Oral)  Resp 16  SpO2 97% Physical Exam  Nursing note and vitals reviewed. Constitutional: He is oriented to person, place, and time. He appears well-developed and well-nourished. No distress.  HENT:  Head: Normocephalic and atraumatic.  Right Ear: External ear and ear canal normal. Tympanic membrane is injected.  Left Ear: Tympanic membrane, external ear and ear canal normal.  Nose: Nose normal. Right sinus exhibits no maxillary sinus tenderness and no frontal sinus tenderness. Left sinus exhibits no maxillary sinus tenderness and no frontal sinus tenderness.  Mouth/Throat: Oropharyngeal exudate and posterior oropharyngeal erythema present.  Right-sided tonsillar erythema and swelling with exudate  Neck: Normal range of motion. Neck supple.  Cardiovascular: Normal rate, regular rhythm, normal heart sounds and intact distal pulses.   Pulmonary/Chest: Effort normal and breath sounds normal. No respiratory distress. He has no wheezes. He has no rales.  Lymphadenopathy:    He has no cervical adenopathy.  Neurological: He is alert and oriented to person, place, and time.  Coordination normal.  Skin: Skin is warm and dry. No rash noted. He is not diaphoretic.  Psychiatric: He has a normal mood and affect. Judgment normal.    ED Course  Procedures (including critical care time) Labs Review Labs Reviewed  POCT RAPID STREP A ([facility]ARE ONLY)    Imaging Review No results found.   MDM   1. Tonsillitis   2. Otalgia, right    Rapid strep is negative, and he is afebrile, most likely viral  tonsillitis. Treat symptomatically, postdated prescription for amoxicillin to take if no improvement in 4-5 days or if worsening despite treatment. Followup as needed.   Meds ordered this encounter  Medications  . methylPREDNISolone (MEDROL DOSEPAK) 4 MG tablet    Sig: Use as directed on package instructions    Dispense:  21 tablet    Refill:  0    Order Specific Question:  Supervising Provider    Answer:  Linna Hoff [phone]  . Pseudoephedrine-Ibuprofen 30-200 MG TABS    Sig: 1-2 tabs PO Q6 hrs PRN    Dispense:  30 each    Refill:  1    Order Specific Question:  Supervising Provider    Answer:  Linna Hoff [phone]  . amoxicillin (AMOXIL) 875 MG tablet    Sig: Take 1 tablet (875 mg total) by mouth 2 (two) times daily.    Dispense:  14 tablet    Refill:  0    Order Specific Question:  Supervising Provider    Answer:  Bradd Canary D [[id number]]      Graylon Good, PA-C 12/10/13 1406

## 2013-12-10 NOTE — ED Provider Notes (Signed)
Medical screening examination/treatment/procedure(s) were performed by resident physician or non-physician practitioner and as supervising physician I was immediately available for consultation/collaboration.   Keegen Heffern DOUGLAS MD.   Kandise Riehle D Aelyn Stanaland, MD 12/10/13 1522 

## 2013-12-10 NOTE — ED Notes (Signed)
C/o sore throat onset 3 days Hurts to swallow; sx also include: dry cough Denies: f/v/n/d Alert, no signs of acute distress.

## 2013-12-12 LAB — CULTURE, GROUP A STREP

## 2014-06-30 ENCOUNTER — Encounter (HOSPITAL_COMMUNITY): Payer: Self-pay

## 2014-06-30 ENCOUNTER — Emergency Department (HOSPITAL_COMMUNITY)
Admission: EM | Admit: 2014-06-30 | Discharge: 2014-06-30 | Disposition: A | Discharge status: Home / Self Care | Payer: BLUE CROSS/BLUE SHIELD | Attending: Emergency Medicine | Admitting: Emergency Medicine

## 2014-06-30 DIAGNOSIS — F419 Anxiety disorder, unspecified: Secondary | ICD-10-CM | POA: Diagnosis present

## 2014-06-30 DIAGNOSIS — G4489 Other headache syndrome: Secondary | ICD-10-CM | POA: Diagnosis not present

## 2014-06-30 DIAGNOSIS — Z79899 Other long term (current) drug therapy: Secondary | ICD-10-CM | POA: Diagnosis not present

## 2014-06-30 NOTE — ED Notes (Signed)
Pt presents with c/o anxiety and migraine. [facility] Pt was hyperventilating when EMS arrived and reported that he has had a migraine since earlier today. Pt took one benadryl and two extra strength tylenol PTA. Pt c/o tingling in his fingers after the hyperventilation.

## 2014-06-30 NOTE — Discharge Instructions (Signed)
General Headache Without Cause A general headache is pain or discomfort felt around the head or neck area. The cause may not be found.  HOME CARE   Keep all doctor visits.  Only take medicines as told by your doctor.  Lie down in a dark, quiet room when you have a headache.  Keep a journal to find out if certain things bring on headaches. For example, write down:  What you eat and drink.  How much sleep you get.  Any change to your diet or medicines.  Relax by getting a massage or doing other relaxing activities.  Put ice or heat packs on the head and neck area as told by your doctor.  Lessen stress.  Sit up straight. Do not tighten (tense) your muscles.  Quit smoking if you smoke.  Lessen how much alcohol you drink.  Lessen how much caffeine you drink, or stop drinking caffeine.  Eat and sleep on a regular schedule.  Get 7 to 9 hours of sleep, or as told by your doctor.  Keep lights dim if bright lights bother you or make your headaches worse. GET HELP RIGHT AWAY IF:   Your headache becomes really bad.  You have a fever.  You have a stiff neck.  You have trouble seeing.  Your muscles are weak, or you lose muscle control.  You lose your balance or have trouble walking.  You feel like you will pass out (faint), or you pass out.  You have really bad symptoms that are different than your first symptoms.  You have problems with the medicines given to you by your doctor.  Your medicines do not work.  Your headache feels different than the other headaches.  You feel sick to your stomach (nauseous) or throw up (vomit). MAKE SURE YOU:   Understand these instructions.  Will watch your condition.  Will get help right away if you are not doing well or get worse. Document Released: 12/26/2007 Document Revised: 06/10/2011 Document Reviewed: 03/08/2011 [facility] Patient Information 2015 [employer] [place]. This information is not intended to replace advice given to  you by your health care provider. Make sure you discuss any questions you have with your health care provider.  Panic Attacks Panic attacks are sudden, short feelings of great fear or discomfort. You may have them for no reason when you are relaxed, when you are uneasy (anxious), or when you are sleeping.  HOME CARE  Take all your medicines as told.  Check with your doctor before starting new medicines.  Keep all doctor visits. GET HELP IF:  You are not able to take your medicines as told.  Your symptoms do not get better.  Your symptoms get worse. GET HELP RIGHT AWAY IF:  Your attacks seem different than your normal attacks.  You have thoughts about hurting yourself or others.  You take panic attack medicine and you have a side effect. MAKE SURE YOU:  Understand these instructions.  Will watch your condition.  Will get help right away if you are not doing well or get worse. Document Released: 04/20/2010 Document Revised: 01/06/2013 Document Reviewed: 10/30/2012 [facility] Patient Information 2015 [place], [place]. This information is not intended to replace advice given to you by your health care provider. Make sure you discuss any questions you have with your health care provider.

## 2014-06-30 NOTE — ED Provider Notes (Signed)
CSN: [id number]     Arrival date & time 06/30/14  2025 History   First MD Initiated Contact with Patient 06/30/14 2237     Chief Complaint  Patient presents with  . Migraine  . Anxiety     (Consider location/radiation/quality/duration/timing/severity/associated sxs/prior Treatment) HPI  Isaiah Morales is a 34 y.o. male who presents for evaluation of headache, and rapid breathing, onset after a stressful situation at work. 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 Both the headache and rapid breathing have resolved. He has intermittent headaches that seem to be associated with exposure to tobacco smoke. He denies fever, cough, chest pain, weakness or dizziness. He tends to get about one migraine headache each month. He does not have a primary care doctor. There are no other known modifying factors.   Past Medical History  Diagnosis Date  . Migraine    Past Surgical History  Procedure Laterality Date  . Knee surgery      right x 2   No family history on file. History  Substance Use Topics  . Smoking status: Never Smoker   . Smokeless tobacco: Not on file  . Alcohol Use: No    Review of Systems  All other systems reviewed and are negative.     Allergies  Review of patient's allergies indicates no known allergies.  Home Medications   Prior to Admission medications   Medication Sig Start Date End Date Taking? Authorizing Provider  aspirin-acetaminophen-caffeine (EXCEDRIN MIGRAINE) [id number] MG per tablet Take 2 tablets by mouth every 6 (six) hours as needed for headache or migraine.   Yes Historical Provider, MD  diphenhydrAMINE (BENADRYL) 25 MG tablet Take 25 mg by mouth every 6 (six) hours as needed for allergies.   Yes Historical Provider, MD  amoxicillin (AMOXIL) 875 MG tablet Take 1 tablet (875 mg total) by mouth 2 (two) times daily. Patient not taking: Reported on 06/30/2014 12/10/13   Graylon GoodZachary H Baker,  PA-C  methylPREDNISolone (MEDROL DOSEPAK) 4 MG tablet Use as directed on package instructions Patient not taking: Reported on 06/30/2014 12/10/13   Graylon GoodZachary H Baker, PA-C  naproxen (NAPROSYN) 500 MG tablet Take 1 tablet (500 mg total) by mouth 2 (two) times daily. Patient not taking: Reported on 06/30/2014 11/19/13   Antony MaduraKelly Humes, PA-C  Pseudoephedrine-Ibuprofen 30-200 MG TABS 1-2 tabs PO Q6 hrs PRN Patient not taking: Reported on 06/30/2014 12/10/13   Graylon GoodZachary H Baker, PA-C  SUMAtriptan (IMITREX) 100 MG tablet Take 1 tablet (100 mg total) by mouth once as needed for migraine or headache. May repeat once in 2 hours if headache persists or recurs.  Do not exceed 2 pills in 24hours. Patient not taking: Reported on 06/30/2014 11/16/13   Drema DallasAdam R Jaffe, DO  topiramate (TOPAMAX) 25 MG tablet Take 1 tablet (25 mg total) by mouth 2 (two) times daily. Patient not taking: Reported on 06/30/2014 11/16/13   Drema DallasAdam R Jaffe, DO   BP 116/70 mmHg  Pulse 72  Temp(Src) 98.3 F (36.8 C) (Oral)  Resp 22  SpO2 100% Physical Exam  Constitutional: He is oriented to person, place, and time. He appears well-developed and well-nourished.  HENT:  Head: Normocephalic and atraumatic.  Right Ear: External ear normal.  Left Ear: External ear normal.  Eyes: Conjunctivae and EOM are normal. Pupils are equal, round, and reactive to light.  Neck: Normal range of motion and phonation normal. Neck supple.  Cardiovascular: Normal rate, regular rhythm and normal heart sounds.  Pulmonary/Chest: Effort normal and breath sounds normal. He exhibits no bony tenderness.  Abdominal: Soft. There is no tenderness.  Musculoskeletal: Normal range of motion.  Neurological: He is alert and oriented to person, place, and time. No cranial nerve deficit or sensory deficit. He exhibits normal muscle tone. Coordination normal.  Skin: Skin is warm, dry and intact.  Psychiatric: He has a normal mood and affect. His behavior is normal. Judgment and thought  content normal.  Nursing note and vitals reviewed.   ED Course  Procedures (including critical care time)  Findings discussed with patient, all questions answered  Labs Review Labs Reviewed - No data to display  Imaging Review No results found.   EKG Interpretation None      MDM   Final diagnoses:  Other headache syndrome  Anxiety    Nonspecific headache and anxiety. Symptoms are transient. Doubt serious bacterial illness, metabolic instability or CVA.  Nursing Notes Reviewed/ Care Coordinated Applicable Imaging Reviewed Interpretation of Laboratory Data incorporated into ED treatment  The patient appears reasonably screened and/or stabilized for discharge and I doubt any other medical condition or other [facility] requiring further screening, evaluation, or treatment in the ED at this time prior to discharge.  Plan: Home Medications- none; Home Treatments- rest; return here if the recommended treatment, does not improve the symptoms; Recommended follow up- PCP of choice prn    Mancel Bale, MD 06/30/14 2349

## 2014-11-09 ENCOUNTER — Emergency Department (INDEPENDENT_AMBULATORY_CARE_PROVIDER_SITE_OTHER)
Admission: EM | Admit: 2014-11-09 | Discharge: 2014-11-09 | Disposition: A | Discharge status: Other Acute Inpatient Hospital | Payer: BLUE CROSS/BLUE SHIELD | Source: Home / Self Care | Attending: Family Medicine | Admitting: Family Medicine

## 2014-11-09 ENCOUNTER — Encounter (HOSPITAL_COMMUNITY): Payer: Self-pay | Admitting: *Deleted

## 2014-11-09 ENCOUNTER — Emergency Department (HOSPITAL_COMMUNITY)
Admission: EM | Admit: 2014-11-09 | Discharge: 2014-11-10 | Disposition: A | Discharge status: Home / Self Care | Payer: BLUE CROSS/BLUE SHIELD | Attending: Emergency Medicine | Admitting: Emergency Medicine

## 2014-11-09 DIAGNOSIS — B269 Mumps without complication: Secondary | ICD-10-CM

## 2014-11-09 DIAGNOSIS — K113 Abscess of salivary gland: Secondary | ICD-10-CM

## 2014-11-09 DIAGNOSIS — G43909 Migraine, unspecified, not intractable, without status migrainosus: Secondary | ICD-10-CM | POA: Diagnosis not present

## 2014-11-09 DIAGNOSIS — R220 Localized swelling, mass and lump, head: Secondary | ICD-10-CM | POA: Diagnosis present

## 2014-11-09 LAB — POCT RAPID STREP A: STREPTOCOCCUS, GROUP A SCREEN (DIRECT): NEGATIVE

## 2014-11-09 NOTE — ED Notes (Addendum)
Pt states that he had pink eye 2 weeks ago which was treated and then has had worsening swelling to left side of face. Pt states that he was started on abx 1 week ago for facial swelling. States that he stopped taking them yesterday because they were not helping. Pt went to UC today and was sent here for ENT(Dr Jearld Fenton) evaluation, CT and IV abx. Pt was diagnosed with parotid abscess at urgent care.

## 2014-11-09 NOTE — ED Provider Notes (Signed)
CSN: [id number]     Arrival date & time 11/09/14  1624 History   First MD Initiated Contact with Patient 11/09/14 1721     Chief Complaint  Patient presents with  . Facial Swelling   (Consider location/radiation/quality/duration/timing/severity/associated sxs/prior Treatment) Patient is a 34 y.o. male presenting with general illness. The history is provided by the patient.  Illness Severity:  Moderate Duration:  2 weeks Progression:  Worsening Chronicity:  New Context:  Left facial pain and sts for 2 weeks getting worse in spite of keflex . Associated symptoms: no fever and no sore throat   Associated symptoms comment:  Left eye erythema.   Past Medical History  Diagnosis Date  . Migraine    Past Surgical History  Procedure Laterality Date  . Knee surgery      right x 2   History reviewed. No pertinent family history. Social History  Substance Use Topics  . Smoking status: Never Smoker   . Smokeless tobacco: None  . Alcohol Use: No    Review of Systems  Constitutional: Negative.  Negative for fever.  HENT: Positive for facial swelling. Negative for sore throat.   Eyes: Positive for redness.    Allergies  Review of patient's allergies indicates no known allergies.  Home Medications   Prior to Admission medications   Medication Sig Start Date End Date Taking? Authorizing Provider  Cephalexin (KEFLEX PO) Take by mouth.   Yes Historical Provider, MD  CIPROFLOXACIN HCL OP Apply to eye.   Yes Historical Provider, MD  amoxicillin (AMOXIL) 875 MG tablet Take 1 tablet (875 mg total) by mouth 2 (two) times daily. Patient not taking: Reported on 06/30/2014 12/10/13   Graylon Good, PA-C  aspirin-acetaminophen-caffeine (EXCEDRIN MIGRAINE) [phone] MG per tablet Take 2 tablets by mouth every 6 (six) hours as needed for headache or migraine.    Historical Provider, MD  diphenhydrAMINE (BENADRYL) 25 MG tablet Take 25 mg by mouth every 6 (six) hours as needed for allergies.     Historical Provider, MD  methylPREDNISolone (MEDROL DOSEPAK) 4 MG tablet Use as directed on package instructions Patient not taking: Reported on 06/30/2014 12/10/13   Graylon Good, PA-C  naproxen (NAPROSYN) 500 MG tablet Take 1 tablet (500 mg total) by mouth 2 (two) times daily. Patient not taking: Reported on 06/30/2014 11/19/13   Antony Madura, PA-C  Pseudoephedrine-Ibuprofen 30-200 MG TABS 1-2 tabs PO Q6 hrs PRN Patient not taking: Reported on 06/30/2014 12/10/13   Graylon Good, PA-C  SUMAtriptan (IMITREX) 100 MG tablet Take 1 tablet (100 mg total) by mouth once as needed for migraine or headache. May repeat once in 2 hours if headache persists or recurs.  Do not exceed 2 pills in 24hours. Patient not taking: Reported on 06/30/2014 11/16/13   Drema Dallas, DO  topiramate (TOPAMAX) 25 MG tablet Take 1 tablet (25 mg total) by mouth 2 (two) times daily. Patient not taking: Reported on 06/30/2014 11/16/13   Drema Dallas, DO   BP 142/61 mmHg  Pulse 76  Temp(Src) 98.9 F (37.2 C) (Oral)  Resp 18  SpO2 99% Physical Exam  Constitutional: He is oriented to person, place, and time. He appears well-developed and well-nourished. He appears distressed.  HENT:  Right Ear: External ear normal.  Left Ear: External ear normal.  Mouth/Throat: Oropharynx is clear and moist.  Hard, tender, irreg left preauricular and mand mass sts, no oral/dental problems, tm nl.  Eyes: Conjunctivae are normal. Pupils are equal, round, and reactive  to light.  Neck: Normal range of motion. Neck supple.  Lymphadenopathy:    He has cervical adenopathy.  Neurological: He is alert and oriented to person, place, and time.  Skin: Skin is warm and dry.  Nursing note and vitals reviewed.   ED Course  Procedures (including critical care time) Labs Review Labs Reviewed  POCT RAPID STREP A    Imaging Review No results found.   MDM   1. Parotid abscess    Sent for ct eval and iv abx per dr Jearld Fenton from ent  service.    Linna Hoff, MD 11/09/14 Rickey Primus

## 2014-11-09 NOTE — ED Provider Notes (Signed)
CSN: [id number]     Arrival date & time 11/09/14  1851 History   First MD Initiated Contact with Patient 11/09/14 2248     Chief Complaint  Patient presents with  . Facial Swelling     (Consider location/radiation/quality/duration/timing/severity/associated sxs/prior Treatment) Patient is a 34 y.o. male presenting with general illness. The history is provided by the patient.  Illness Location:  Left eye and left cheek Quality:  Redness of left eye and swelling of left cheek Severity:  Moderate Onset quality:  Gradual Duration:  2 weeks Timing:  Constant Progression:  Unchanged Chronicity:  New Context:  Had left-sided conjunctivitis followed by left side of face swelling Relieved by:  Nothing Worsened by:  Nothing Ineffective treatments:  Keflex Associated symptoms: no congestion, no cough and no fever     Past Medical History  Diagnosis Date  . Migraine    Past Surgical History  Procedure Laterality Date  . Knee surgery      right x 2   No family history on file. Social History  Substance Use Topics  . Smoking status: Never Smoker   . Smokeless tobacco: None  . Alcohol Use: No    Review of Systems  Constitutional: Negative for fever.  HENT: Negative for congestion.   Respiratory: Negative for cough.   All other systems reviewed and are negative.     Allergies  Review of patient's allergies indicates no known allergies.  Home Medications   Prior to Admission medications   Medication Sig Start Date End Date Taking? Authorizing Provider  aspirin-acetaminophen-caffeine (EXCEDRIN MIGRAINE) [phone] MG per tablet Take 2 tablets by mouth every 6 (six) hours as needed for headache or migraine.   Yes Historical Provider, MD  diphenhydrAMINE (BENADRYL) 25 MG tablet Take 25 mg by mouth every 6 (six) hours as needed for allergies.   Yes Historical Provider, MD  ibuprofen (ADVIL,MOTRIN) 200 MG tablet Take 600 mg by mouth every 6 (six) hours as needed for mild pain.    Yes Historical Provider, MD  naproxen sodium (ANAPROX) 220 MG tablet Take 440 mg by mouth daily as needed (general pain).   Yes Historical Provider, MD  amoxicillin (AMOXIL) 875 MG tablet Take 1 tablet (875 mg total) by mouth 2 (two) times daily. Patient not taking: Reported on 06/30/2014 12/10/13   Liam Graham, PA-C  HYDROcodone-acetaminophen (NORCO/VICODIN) 5-325 MG per tablet Take 1 tablet by mouth every 6 (six) hours as needed for moderate pain. 11/10/14   Leo Grosser, MD   BP 121/81 mmHg  Pulse 82  Temp(Src) 98.6 F (37 C) (Oral)  Resp 18  SpO2 99% Physical Exam  Constitutional: He is oriented to person, place, and time. He appears well-developed and well-nourished. No distress.  HENT:  Head: Normocephalic and atraumatic.  Swelling over parotid gland at mandibular angle with tenderness. No erythema, no fluctuance  Eyes: Pupils are equal, round, and reactive to light. Left conjunctiva is injected. Left conjunctiva has no hemorrhage.  Neck: Neck supple. No tracheal deviation present.  Cardiovascular: Normal rate and regular rhythm.   Pulmonary/Chest: Effort normal. No respiratory distress.  Abdominal: Soft. He exhibits no distension.  Neurological: He is alert and oriented to person, place, and time.  Skin: Skin is warm and dry.  Psychiatric: He has a normal mood and affect.    ED Course  Procedures (including critical care time) Labs Review Labs Reviewed  BASIC METABOLIC PANEL - Abnormal; Notable for the following:    Potassium 7.4 (*)  All other components within normal limits  SEDIMENTATION RATE - Abnormal; Notable for the following:    Sed Rate 20 (*)    All other components within normal limits  CULTURE, GROUP A STREP  CBC WITH DIFFERENTIAL/PLATELET  POTASSIUM    Imaging Review No results found.   EKG Interpretation None      MDM   Final diagnoses:  Infectious parotitis    34 year old male presents with left cheek swelling after contracting a  viral conjunctivitis in his left eye. He was started on empiric antibiotics with Keflex but has stopped over the last 3 days because of inefficacy. At this time he has no fevers, no signs of localized infection to suspect underlying abscess, has a viral prodromal illness without signs of bacterial infection. I suspect a viral parotitis versus parotid gland outlet dysfunction. No indication for emergent imaging currently as patient is low risk for complications from parotitis and is otherwise healthy/not septic. I discussed the case with Dr. Janace Hoard who agreed that outpatient management is reasonable at this point and the patient will be discharged with plan to follow up with ENT on an outpatient basis. No indication for antibiotics currently as patient had no change on Keflex and does not appear to be worsening since self discontinuing, ESR is only minimally elevated, there is no leukocytosis; highly likely that this is due to a viral etiology. Return precautions discussed for worsening.      Leo Grosser, MD 11/10/14 [phone]

## 2014-11-09 NOTE — ED Notes (Signed)
Pt  Reports           Swelling     To          l  Side  Of  Face        Seen  Recently    At  [facility]  Was  Given  Unknown  Eye  Med  An antibiotic  Now  Has  Swelling to  Face  l  Eye  Red  And       sorethroat

## 2014-11-10 LAB — BASIC METABOLIC PANEL
Anion gap: 6 (ref 5–15)
BUN: 9 mg/dL (ref 6–20)
CO2: 27 mmol/L (ref 22–32)
Calcium: 9.1 mg/dL (ref 8.9–10.3)
Chloride: 103 mmol/L (ref 101–111)
Creatinine, Ser: 0.91 mg/dL (ref 0.61–1.24)
GFR calc Af Amer: >60 mL/min (ref >60)
GLUCOSE: 90 mg/dL (ref 65–99)
Potassium: 7.4 mmol/L (ref 3.5–5.1)
Sodium: 136 mmol/L (ref 135–145)

## 2014-11-10 LAB — CBC WITH DIFFERENTIAL/PLATELET
HCT: 42.8 % (ref 39.0–52.0)
Hemoglobin: 14.5 g/dL (ref 13.0–17.0)
MCH: 27.7 pg (ref 26.0–34.0)
MCHC: 33.9 g/dL (ref 30.0–36.0)
MCV: 81.7 fL (ref 78.0–100.0)
PLATELETS: 251 10*3/uL (ref 150–400)
RBC: 5.24 MIL/uL (ref 4.22–5.81)
RDW: 14.3 % (ref 11.5–15.5)
WBC: 7.6 10*3/uL (ref 4.0–10.5)

## 2014-11-10 LAB — POTASSIUM: Potassium: 4.2 mmol/L (ref 3.5–5.1)

## 2014-11-10 LAB — SEDIMENTATION RATE: Sed Rate: 20 mm/hr — ABNORMAL HIGH (ref 0–16)

## 2014-11-10 MED ORDER — HYDROCODONE-ACETAMINOPHEN 5-325 MG PO TABS: 1.0000 | ORAL_TABLET | Freq: Four times a day (QID) | ORAL | Status: AC | PRN

## 2014-11-10 MED ORDER — HYDROCODONE-ACETAMINOPHEN 5-325 MG PO TABS
1.0000 | ORAL_TABLET | Freq: Once | ORAL | Status: AC
Start: 2014-11-10 — End: 2014-11-10
  Administered 2014-11-10: 1 via ORAL
  Filled 2014-11-10: qty 1

## 2014-11-10 NOTE — Discharge Instructions (Signed)
Parotitis °Parotitis is soreness and inflammation of one or both parotid glands. The parotid glands produce saliva. They are located on each side of the face, below and in front of the earlobes. The saliva produced comes out of tiny openings (ducts) inside the cheeks. In most cases, parotitis goes away over time or with treatment. If your parotitis is caused by certain long-term (chronic) diseases, it may come back again.  °CAUSES  °Parotitis can be caused by: °· Viral infections. Mumps is one viral infection that can cause parotitis. °· Bacterial infections. °· Blockage of the salivary ducts due to a salivary stone. °· Narrowing of the salivary ducts. °· Swelling of the salivary ducts. °· Dehydration. °· Autoimmune conditions, such as sarcoidosis or Sjogren syndrome. °· Air from activities such as scuba diving, glass blowing, or playing an instrument (rare). °· Human immunodeficiency virus (HIV) or acquired immunodeficiency syndrome (AIDS). °· Tuberculosis. °SIGNS AND SYMPTOMS  °· The ears may appear to be pushed up and out from their normal position. °· Redness (erythema) of the skin over the parotid glands. °· Pain and tenderness over the parotid glands. °· Swelling in the parotid gland area. °· Yellowish-white fluid (pus) coming from the ducts inside the cheeks. °· Dry mouth. °· Bad taste in the mouth. °DIAGNOSIS  °Your health care provider may determine that you have parotitis based on your symptoms and a physical exam. A sample of fluid may also be taken from the parotid gland and tested to find the cause of your infection. X-rays or computed tomography (CT) scans may be taken if your health care provider thinks you might have a salivary stone blocking your salivary duct. °TREATMENT  °Treatment varies depending upon the cause of your parotitis. If your parotitis is caused by mumps, no treatment is needed. The condition will go away on its own after 7 to 10 days. In other cases, treatment may  include: °· Antibiotic medicine if your infection was caused by bacteria. °· Pain medicines. °· Gland massage. °· Eating sour candy to increase your saliva production. °· Removal of salivary stones. Your health care provider may flush stones out with fluids or remove them with tweezers. °· Surgery to remove the parotid glands. °HOME CARE INSTRUCTIONS  °· If you were prescribed an antibiotic medicine, finish it all even if you start to feel better. °· Put warm compresses on the sore area. °· Take medicines only as directed by your health care provider. °· Drink enough fluids to keep your urine clear or pale yellow. °SEEK IMMEDIATE MEDICAL CARE IF:  °· You have increasing pain or swelling that is not controlled with medicine. °· You have a fever. °MAKE SURE YOU: °· Understand these instructions. °· Will watch your condition. °· Will get help right away if you are not doing well or get worse. °Document Released: 09/07/2001 Document Revised: 08/02/2013 Document Reviewed: 02/11/2011 °[employer]® Patient Information ©2015 [employer]. This information is not intended to replace advice given to you by your health care provider. Make sure you discuss any questions you have with your health care provider. ° °

## 2014-11-10 NOTE — ED Notes (Signed)
Abnormal lab given to Dr. Clydene Pugh

## 2014-11-11 LAB — CULTURE, GROUP A STREP: STREP A CULTURE: NEGATIVE

## 2014-11-12 ENCOUNTER — Encounter (HOSPITAL_COMMUNITY): Payer: Self-pay

## 2014-11-12 ENCOUNTER — Emergency Department (HOSPITAL_COMMUNITY)
Admission: EM | Admit: 2014-11-12 | Discharge: 2014-11-13 | Disposition: A | Discharge status: Home / Self Care | Payer: BLUE CROSS/BLUE SHIELD | Attending: Emergency Medicine | Admitting: Emergency Medicine

## 2014-11-12 DIAGNOSIS — R61 Generalized hyperhidrosis: Secondary | ICD-10-CM | POA: Insufficient documentation

## 2014-11-12 DIAGNOSIS — R6883 Chills (without fever): Secondary | ICD-10-CM | POA: Insufficient documentation

## 2014-11-12 DIAGNOSIS — G43909 Migraine, unspecified, not intractable, without status migrainosus: Secondary | ICD-10-CM | POA: Insufficient documentation

## 2014-11-12 DIAGNOSIS — R220 Localized swelling, mass and lump, head: Secondary | ICD-10-CM | POA: Diagnosis present

## 2014-11-12 DIAGNOSIS — K1121 Acute sialoadenitis: Secondary | ICD-10-CM

## 2014-11-12 MED ORDER — FENTANYL CITRATE (PF) 100 MCG/2ML IJ SOLN
50.0000 ug | Freq: Once | INTRAMUSCULAR | Status: AC
  Administered 2014-11-12: 50 ug via INTRAVENOUS
  Filled 2014-11-12: qty 2

## 2014-11-12 NOTE — ED Notes (Signed)
Pt seen in ED 11-09-14 for left facial swelling and left conjunctivitis x 2 weeks. Swelling and pain  to  Left side of pain is worsening

## 2014-11-13 ENCOUNTER — Encounter (HOSPITAL_COMMUNITY): Payer: Self-pay | Admitting: Radiology

## 2014-11-13 ENCOUNTER — Emergency Department (HOSPITAL_COMMUNITY): Payer: BLUE CROSS/BLUE SHIELD

## 2014-11-13 LAB — BASIC METABOLIC PANEL
Anion gap: 11 (ref 5–15)
BUN: 10 mg/dL (ref 6–20)
CO2: 25 mmol/L (ref 22–32)
Calcium: 9.3 mg/dL (ref 8.9–10.3)
Chloride: 101 mmol/L (ref 101–111)
Creatinine, Ser: 0.92 mg/dL (ref 0.61–1.24)
GFR calc Af Amer: >60 mL/min (ref >60)
GFR calc non Af Amer: >60 mL/min (ref >60)
Glucose, Bld: 80 mg/dL (ref 65–99)
Potassium: 4 mmol/L (ref 3.5–5.1)
Sodium: 137 mmol/L (ref 135–145)

## 2014-11-13 LAB — CBC WITH DIFFERENTIAL/PLATELET
BASOS ABS: 0 10*3/uL (ref 0.0–0.1)
Basophils Relative: 1 % (ref 0–1)
EOS PCT: 3 % (ref 0–5)
Eosinophils Absolute: 0.2 10*3/uL (ref 0.0–0.7)
HCT: 43.9 % (ref 39.0–52.0)
Hemoglobin: 14.4 g/dL (ref 13.0–17.0)
LYMPHS ABS: 2.2 10*3/uL (ref 0.7–4.0)
Lymphocytes Relative: 25 % (ref 12–46)
MCH: 27.2 pg (ref 26.0–34.0)
MCHC: 32.8 g/dL (ref 30.0–36.0)
MCV: 82.8 fL (ref 78.0–100.0)
MONOS PCT: 10 % (ref 3–12)
Monocytes Absolute: 0.9 10*3/uL (ref 0.1–1.0)
NEUTROS ABS: 5.2 10*3/uL (ref 1.7–7.7)
Neutrophils Relative %: 61 % (ref 43–77)
PLATELETS: 194 10*3/uL (ref 150–400)
RBC: 5.3 MIL/uL (ref 4.22–5.81)
RDW: 13.7 % (ref 11.5–15.5)
WBC: 8.5 10*3/uL (ref 4.0–10.5)

## 2014-11-13 LAB — SEDIMENTATION RATE: SED RATE: 15 mm/h (ref 0–16)

## 2014-11-13 MED ORDER — AMOXICILLIN-POT CLAVULANATE 875-125 MG PO TABS
1.0000 | ORAL_TABLET | Freq: Once | ORAL | Status: AC
  Administered 2014-11-13: 1 via ORAL
  Filled 2014-11-13: qty 1

## 2014-11-13 MED ORDER — HYDROMORPHONE HCL 1 MG/ML IJ SOLN
1.0000 mg | Freq: Once | INTRAMUSCULAR | Status: AC
  Administered 2014-11-13: 1 mg via INTRAVENOUS
  Filled 2014-11-13: qty 1

## 2014-11-13 MED ORDER — IOHEXOL 300 MG/ML  SOLN
75.0000 mL | Freq: Once | INTRAMUSCULAR | Status: AC | PRN
  Administered 2014-11-13: 75 mL via INTRAVENOUS

## 2014-11-13 MED ORDER — ONDANSETRON HCL 4 MG/2ML IJ SOLN
4.0000 mg | Freq: Once | INTRAMUSCULAR | Status: AC
  Administered 2014-11-13: 4 mg via INTRAVENOUS
  Filled 2014-11-13: qty 2

## 2014-11-13 MED ORDER — AMOXICILLIN-POT CLAVULANATE 875-125 MG PO TABS: 1.0000 | ORAL_TABLET | Freq: Two times a day (BID) | ORAL | Status: AC

## 2014-11-13 MED ORDER — OXYCODONE-ACETAMINOPHEN 7.5-325 MG PO TABS: 1.0000 | ORAL_TABLET | ORAL | Status: AC | PRN

## 2014-11-13 NOTE — ED Notes (Signed)
Pt left at this time with all belongings, family member driving him home.

## 2014-11-13 NOTE — ED Provider Notes (Signed)
CSN: [id number]     Arrival date & time 11/12/14  1904 History  This chart was scribed for Dione Booze, MD by Leone Payor, ED Scribe. This patient was seen in room A09C/A09C and the patient's care was started 12:23 AM.    Chief Complaint  Patient presents with  . Facial Swelling    The history is provided by the patient. No language interpreter was used.     HPI Comments: Isaiah Morales is a 34 y.o. male who presents to the Emergency Department complaining of 2 weeks of constant, gradually worsening left facial swelling and pain. Family member states the symptoms initially began with conjunctivitis diagnosed at [facility]. He was prescribed eye drops and then antibiotics. She states patient was most recently seen at [facility] ED on 11/09/14. He has associated chills and sweats. He rates his pain as 10/10 currently which is aggravated by opening and closing the mouth. Family member denies fever.   Past Medical History  Diagnosis Date  . Migraine    Past Surgical History  Procedure Laterality Date  . Knee surgery      right x 2   History reviewed. No pertinent family history. Social History  Substance Use Topics  . Smoking status: Never Smoker   . Smokeless tobacco: None  . Alcohol Use: No    Review of Systems  Constitutional: Positive for chills and diaphoresis. Negative for fever.  HENT: Positive for facial swelling.        +facial pain  All other systems reviewed and are negative.     Allergies  Review of patient's allergies indicates no known allergies.  Home Medications   Prior to Admission medications   Medication Sig Start Date End Date Taking? Authorizing Provider  amoxicillin (AMOXIL) 875 MG tablet Take 1 tablet (875 mg total) by mouth 2 (two) times daily. Patient not taking: Reported on 06/30/2014 12/10/13   Graylon Good, PA-C  aspirin-acetaminophen-caffeine (EXCEDRIN MIGRAINE) [phone] MG per tablet Take 2 tablets by mouth every 6 (six) hours as needed  for headache or migraine.    Historical Provider, MD  diphenhydrAMINE (BENADRYL) 25 MG tablet Take 25 mg by mouth every 6 (six) hours as needed for allergies.    Historical Provider, MD  HYDROcodone-acetaminophen (NORCO/VICODIN) 5-325 MG per tablet Take 1 tablet by mouth every 6 (six) hours as needed for moderate pain. 11/10/14   Lyndal Pulley, MD  ibuprofen (ADVIL,MOTRIN) 200 MG tablet Take 600 mg by mouth every 6 (six) hours as needed for mild pain.    Historical Provider, MD  naproxen sodium (ANAPROX) 220 MG tablet Take 440 mg by mouth daily as needed (general pain).    Historical Provider, MD   BP 118/85 mmHg  Pulse 89  Temp(Src) 98 F (36.7 C) (Oral)  Resp 16  Ht 5\' 11"  (1.803 m)  Wt 191 lb (86.637 kg)  BMI 26.65 kg/m2  SpO2 97% Physical Exam  Constitutional: He is oriented to person, place, and time. He appears well-developed and well-nourished.  In pain   HENT:  Head: Normocephalic and atraumatic.  5 cm x 6 cm tender mass in the region of the left parotid gland. Mild erythema of the overlying skin. No fluctuance.   Cardiovascular: Normal rate.   Pulmonary/Chest: Effort normal.  Abdominal: He exhibits no distension.  Neurological: He is alert and oriented to person, place, and time.  Skin: Skin is warm and dry.  Psychiatric: He has a normal mood and affect.  Nursing note  and vitals reviewed.   ED Course  Procedures (including critical care time)  DIAGNOSTIC STUDIES: Oxygen Saturation is 97% on RA, adequate by my interpretation.    COORDINATION OF CARE: 12:30 AM Discussed treatment plan with pt at bedside and pt agreed to plan.   Labs Review Results for orders placed or performed during the hospital encounter of 11/12/14  Basic metabolic panel  Result Value Ref Range   Sodium 137 135 - 145 mmol/L   Potassium 4.0 3.5 - 5.1 mmol/L   Chloride 101 101 - 111 mmol/L   CO2 25 22 - 32 mmol/L   Glucose, Bld 80 65 - 99 mg/dL   BUN 10 6 - 20 mg/dL   Creatinine, Ser 3.08 0.61  - 1.24 mg/dL   Calcium 9.3 8.9 - 65.7 mg/dL   GFR calc non Af Amer >60 >60 mL/min   GFR calc Af Amer >60 >60 mL/min   Anion gap 11 5 - 15  CBC with Differential  Result Value Ref Range   WBC 8.5 4.0 - 10.5 K/uL   RBC 5.30 4.22 - 5.81 MIL/uL   Hemoglobin 14.4 13.0 - 17.0 g/dL   HCT 84.6 96.2 - 95.2 %   MCV 82.8 78.0 - 100.0 fL   MCH 27.2 26.0 - 34.0 pg   MCHC 32.8 30.0 - 36.0 g/dL   RDW 84.1 32.4 - 40.1 %   Platelets 194 150 - 400 K/uL   Neutrophils Relative % 61 43 - 77 %   Neutro Abs 5.2 1.7 - 7.7 K/uL   Lymphocytes Relative 25 12 - 46 %   Lymphs Abs 2.2 0.7 - 4.0 K/uL   Monocytes Relative 10 3 - 12 %   Monocytes Absolute 0.9 0.1 - 1.0 K/uL   Eosinophils Relative 3 0 - 5 %   Eosinophils Absolute 0.2 0.0 - 0.7 K/uL   Basophils Relative 1 0 - 1 %   Basophils Absolute 0.0 0.0 - 0.1 K/uL  Sedimentation rate  Result Value Ref Range   Sed Rate 15 0 - 16 mm/hr   Imaging Review Ct Maxillofacial W/cm  11/13/2014   CLINICAL DATA:  Initial valuation for left facial swelling.  EXAM: CT MAXILLOFACIAL WITH CONTRAST  TECHNIQUE: Multidetector CT imaging of the maxillofacial structures was performed with intravenous contrast. Multiplanar CT image reconstructions were also generated. A small metallic BB was placed on the right temple in order to reliably differentiate right from left.  CONTRAST:  75mL OMNIPAQUE IOHEXOL 300 MG/ML  SOLN  COMPARISON:  None.  FINDINGS: Visualized portions of the brain are unremarkable. No acute abnormality about the orbits.  Mild opacity within the right frontoethmoidal recess. Paranasal sinuses mastoid air cells are otherwise clear.  Right parotid gland and submandibular glands are normal.  The left parotid gland is asymmetrically enlarged and somewhat edematous as compared to the right, suggesting acute parotitis. There is prominent inflammatory stranding within the overlying soft tissues of the left face, involving the left masticator space extending inferiorly towards  the left submandibular space. No loculated collection to suggest abscess. There are multiple enlarged left level 2 lymph nodes measuring up to 17 mm in short axis. Prominent level 1 B nodes measure up to 1 cm.  Oral cavity within normal limits. No acute abnormality about the dentition. Palatine tonsils normal. Parapharyngeal fat preserved. Nasopharynx within normal limits. No retropharyngeal fluid collection.  No acute osseous abnormality.  IMPRESSION: 1. Findings most consistent with acute left-sided parotitis with associated cellulitis within the left face.  No abscess or drainable fluid collection identified. 2. Left-sided cervical adenopathy as above, likely reactive.   Electronically Signed   By: Rise Mu M.D.   On: 11/13/2014 03:51   I, Dione Booze, MD, personally reviewed and evaluated these images and lab results as part of my medical decision-making.   MDM   Final diagnoses:  Parotitis, acute    Swelling in the left parotid gland with severe pain. Findings and physical exam are not suggestive of abscess. Old records are reviewed and he had been seen 3 days ago and had normal labs including normal sedimentation rate and decision was made to treat him as a viral infection. Because of worsening pain and swelling, he'll be sent for CT scan.  CT shows swollen parotid gland without evidence of abscess and also some adenopathy. He had been given IV fluids and hydromorphone for pain with good relief. Sedimentation rate is still normal and there is no leukocytosis and no fever. Case was discussed with Dr. Annalee Genta of ENT service who feels patient should be treated for possible bacterial parotitis given the progression of his symptoms. He is discharged with prescription for amoxicillin-clavulanic acid and oxycodone have acetaminophen for pain. He is to follow-up with Dr. Annalee Genta in 2 weeks if he has good clinical response, but in 3-5 days if he is not improving.  I personally performed  the services described in this documentation, which was scribed in my presence. The recorded information has been reviewed and is accurate.     Dione Booze, MD 11/13/14 [phone]

## 2014-11-13 NOTE — Discharge Instructions (Signed)
If you are not starting to improve after three days, or if you are getting worse, then see the ENT doctor this week or return to the ED.   Parotitis Parotitis is soreness and inflammation of one or both parotid glands. The parotid glands produce saliva. They are located on each side of the face, below and in front of the earlobes. The saliva produced comes out of tiny openings (ducts) inside the cheeks. In most cases, parotitis goes away over time or with treatment. If your parotitis is caused by certain long-term (chronic) diseases, it may come back again.  CAUSES  Parotitis can be caused by:  Viral infections. Mumps is one viral infection that can cause parotitis.  Bacterial infections.  Blockage of the salivary ducts due to a salivary stone.  Narrowing of the salivary ducts.  Swelling of the salivary ducts.  Dehydration.  Autoimmune conditions, such as sarcoidosis or Sjogren syndrome.  Air from activities such as scuba diving, glass blowing, or playing an instrument (rare).  Human immunodeficiency virus (HIV) or acquired immunodeficiency syndrome (AIDS).  Tuberculosis. SIGNS AND SYMPTOMS   The ears may appear to be pushed up and out from their normal position.  Redness (erythema) of the skin over the parotid glands.  Pain and tenderness over the parotid glands.  Swelling in the parotid gland area.  Yellowish-white fluid (pus) coming from the ducts inside the cheeks.  Dry mouth.  Bad taste in the mouth. DIAGNOSIS  Your health care provider may determine that you have parotitis based on your symptoms and a physical exam. A sample of fluid may also be taken from the parotid gland and tested to find the cause of your infection. X-rays or computed tomography (CT) scans may be taken if your health care provider thinks you might have a salivary stone blocking your salivary duct. TREATMENT  Treatment varies depending upon the cause of your parotitis. If your parotitis is  caused by mumps, no treatment is needed. The condition will go away on its own after 7 to 10 days. In other cases, treatment may include:  Antibiotic medicine if your infection was caused by bacteria.  Pain medicines.  Gland massage.  Eating sour candy to increase your saliva production.  Removal of salivary stones. Your health care provider may flush stones out with fluids or remove them with tweezers.  Surgery to remove the parotid glands. HOME CARE INSTRUCTIONS   If you were prescribed an antibiotic medicine, finish it all even if you start to feel better.  Put warm compresses on the sore area.  Take medicines only as directed by your health care provider.  Drink enough fluids to keep your urine clear or pale yellow. SEEK IMMEDIATE MEDICAL CARE IF:   You have increasing pain or swelling that is not controlled with medicine.  You have a fever. MAKE SURE YOU:  Understand these instructions.  Will watch your condition.  Will get help right away if you are not doing well or get worse. Document Released: 09/07/2001 Document Revised: 08/02/2013 Document Reviewed: 02/11/2011 [facility] Patient Information 2015 [place], [place]. This information is not intended to replace advice given to you by your health care provider. Make sure you discuss any questions you have with your health care provider.  Amoxicillin; Clavulanic Acid tablets What is this medicine? AMOXICILLIN; CLAVULANIC ACID (a mox i SIL in; KLAV yoo lan ic AS id) is a penicillin antibiotic. It is used to treat certain kinds of bacterial infections. It will not work for colds,  flu, or other viral infections. This medicine may be used for other purposes; ask your health care provider or pharmacist if you have questions. COMMON BRAND NAME(S): Augmentin What should I tell my health care provider before I take this medicine? They need to know if you have any of these conditions: -bowel disease, like colitis -kidney  disease -liver disease -mononucleosis -an unusual or allergic reaction to amoxicillin, penicillin, cephalosporin, other antibiotics, clavulanic acid, other medicines, foods, dyes, or preservatives -pregnant or trying to get pregnant -breast-feeding How should I use this medicine? Take this medicine by mouth with a full glass of water. Follow the directions on the prescription label. Take at the start of a meal. Do not crush or chew. If the tablet has a score line, you may cut it in half at the score line for easier swallowing. Take your medicine at regular intervals. Do not take your medicine more often than directed. Take all of your medicine as directed even if you think you are better. Do not skip doses or stop your medicine early. Talk to your pediatrician regarding the use of this medicine in children. Special care may be needed. Overdosage: If you think you have taken too much of this medicine contact a poison control center or emergency room at once. NOTE: This medicine is only for you. Do not share this medicine with others. What if I miss a dose? If you miss a dose, take it as soon as you can. If it is almost time for your next dose, take only that dose. Do not take double or extra doses. What may interact with this medicine? -allopurinol -anticoagulants -birth control pills -methotrexate -probenecid This list may not describe all possible interactions. Give your health care provider a list of all the medicines, herbs, non-prescription drugs, or dietary supplements you use. Also tell them if you smoke, drink alcohol, or use illegal drugs. Some items may interact with your medicine. What should I watch for while using this medicine? Tell your doctor or health care professional if your symptoms do not improve. Do not treat diarrhea with over the counter products. Contact your doctor if you have diarrhea that lasts more than 2 days or if it is severe and watery. If you have diabetes, you  may get a false-positive result for sugar in your urine. Check with your doctor or health care professional. Birth control pills may not work properly while you are taking this medicine. Talk to your doctor about using an extra method of birth control. What side effects may I notice from receiving this medicine? Side effects that you should report to your doctor or health care professional as soon as possible: -allergic reactions like skin rash, itching or hives, swelling of the face, lips, or tongue -breathing problems -dark urine -fever or chills, sore throat -redness, blistering, peeling or loosening of the skin, including inside the mouth -seizures -trouble passing urine or change in the amount of urine -unusual bleeding, bruising -unusually weak or tired -white patches or sores in the mouth or throat Side effects that usually do not require medical attention (report to your doctor or health care professional if they continue or are bothersome): -diarrhea -dizziness -headache -nausea, vomiting -stomach upset -vaginal or anal irritation This list may not describe all possible side effects. Call your doctor for medical advice about side effects. You may report side effects to FDA at 1-800-FDA-1088. Where should I keep my medicine? Keep out of the reach of children. Store at room temperature  below 25 degrees C (77 degrees F). Keep container tightly closed. Throw away any unused medicine after the expiration date. NOTE: This sheet is a summary. It may not cover all possible information. If you have questions about this medicine, talk to your doctor, pharmacist, or health care provider.  2015, Elsevier/Gold Standard. (2007-06-11 12:04:30)  Acetaminophen; Oxycodone tablets What is this medicine? ACETAMINOPHEN; OXYCODONE (a set a MEE noe fen; ox i KOE done) is a pain reliever. It is used to treat mild to moderate pain. This medicine may be used for other purposes; ask your health care  provider or pharmacist if you have questions. COMMON BRAND NAME(S): Endocet, Magnacet, Narvox, Percocet, Perloxx, Primalev, Primlev, Roxicet, Xolox What should I tell my health care provider before I take this medicine? They need to know if you have any of these conditions: -brain tumor -Crohn's disease, inflammatory bowel disease, or ulcerative colitis -drug abuse or addiction -head injury -heart or circulation problems -if you often drink alcohol -kidney disease or problems going to the bathroom -liver disease -lung disease, asthma, or breathing problems -an unusual or allergic reaction to acetaminophen, oxycodone, other opioid analgesics, other medicines, foods, dyes, or preservatives -pregnant or trying to get pregnant -breast-feeding How should I use this medicine? Take this medicine by mouth with a full glass of water. Follow the directions on the prescription label. Take your medicine at regular intervals. Do not take your medicine more often than directed. Talk to your pediatrician regarding the use of this medicine in children. Special care may be needed. Patients over 70 years old may have a stronger reaction and need a smaller dose. Overdosage: If you think you have taken too much of this medicine contact a poison control center or emergency room at once. NOTE: This medicine is only for you. Do not share this medicine with others. What if I miss a dose? If you miss a dose, take it as soon as you can. If it is almost time for your next dose, take only that dose. Do not take double or extra doses. What may interact with this medicine? -alcohol -antihistamines -barbiturates like amobarbital, butalbital, butabarbital, methohexital, pentobarbital, phenobarbital, thiopental, and secobarbital -benztropine -drugs for bladder problems like solifenacin, trospium, oxybutynin, tolterodine, hyoscyamine, and methscopolamine -drugs for breathing problems like ipratropium and  tiotropium -drugs for certain stomach or intestine problems like propantheline, homatropine methylbromide, glycopyrrolate, atropine, belladonna, and dicyclomine -general anesthetics like etomidate, ketamine, nitrous oxide, propofol, desflurane, enflurane, halothane, isoflurane, and sevoflurane -medicines for depression, anxiety, or psychotic disturbances -medicines for sleep -muscle relaxants -naltrexone -narcotic medicines (opiates) for pain -phenothiazines like perphenazine, thioridazine, chlorpromazine, mesoridazine, fluphenazine, prochlorperazine, promazine, and trifluoperazine -scopolamine -tramadol -trihexyphenidyl This list may not describe all possible interactions. Give your health care provider a list of all the medicines, herbs, non-prescription drugs, or dietary supplements you use. Also tell them if you smoke, drink alcohol, or use illegal drugs. Some items may interact with your medicine. What should I watch for while using this medicine? Tell your doctor or health care professional if your pain does not go away, if it gets worse, or if you have new or a different type of pain. You may develop tolerance to the medicine. Tolerance means that you will need a higher dose of the medication for pain relief. Tolerance is normal and is expected if you take this medicine for a long time. Do not suddenly stop taking your medicine because you may develop a severe reaction. Your body becomes used to the medicine. This  does NOT mean you are addicted. Addiction is a behavior related to getting and using a drug for a non-medical reason. If you have pain, you have a medical reason to take pain medicine. Your doctor will tell you how much medicine to take. If your doctor wants you to stop the medicine, the dose will be slowly lowered over time to avoid any side effects. You may get drowsy or dizzy. Do not drive, use machinery, or do anything that needs mental alertness until you know how this medicine  affects you. Do not stand or sit up quickly, especially if you are an older patient. This reduces the risk of dizzy or fainting spells. Alcohol may interfere with the effect of this medicine. Avoid alcoholic drinks. There are different types of narcotic medicines (opiates) for pain. If you take more than one type at the same time, you may have more side effects. Give your health care provider a list of all medicines you use. Your doctor will tell you how much medicine to take. Do not take more medicine than directed. Call emergency for help if you have problems breathing. The medicine will cause constipation. Try to have a bowel movement at least every 2 to 3 days. If you do not have a bowel movement for 3 days, call your doctor or health care professional. Do not take Tylenol (acetaminophen) or medicines that have acetaminophen with this medicine. Too much acetaminophen can be very dangerous. Many nonprescription medicines contain acetaminophen. Always read the labels carefully to avoid taking more acetaminophen. What side effects may I notice from receiving this medicine? Side effects that you should report to your doctor or health care professional as soon as possible: -allergic reactions like skin rash, itching or hives, swelling of the face, lips, or tongue -breathing difficulties, wheezing -confusion -light headedness or fainting spells -severe stomach pain -unusually weak or tired -yellowing of the skin or the whites of the eyes Side effects that usually do not require medical attention (report to your doctor or health care professional if they continue or are bothersome): -dizziness -drowsiness -nausea -vomiting This list may not describe all possible side effects. Call your doctor for medical advice about side effects. You may report side effects to FDA at 1-800-FDA-1088. Where should I keep my medicine? Keep out of the reach of children. This medicine can be abused. Keep your medicine  in a safe place to protect it from theft. Do not share this medicine with anyone. Selling or giving away this medicine is dangerous and against the law. Store at room temperature between 20 and 25 degrees C (68 and 77 degrees F). Keep container tightly closed. Protect from light. This medicine may cause accidental overdose and death if it is taken by other adults, children, or pets. Flush any unused medicine down the toilet to reduce the chance of harm. Do not use the medicine after the expiration date. NOTE: This sheet is a summary. It may not cover all possible information. If you have questions about this medicine, talk to your doctor, pharmacist, or health care provider.  2015, Elsevier/Gold Standard. (2012-11-09 13:17:35)

## 2014-11-18 ENCOUNTER — Emergency Department (INDEPENDENT_AMBULATORY_CARE_PROVIDER_SITE_OTHER)
Admission: EM | Admit: 2014-11-18 | Discharge: 2014-11-18 | Disposition: A | Discharge status: Home / Self Care | Payer: BLUE CROSS/BLUE SHIELD | Source: Home / Self Care | Attending: Family Medicine | Admitting: Family Medicine

## 2014-11-18 ENCOUNTER — Encounter (HOSPITAL_COMMUNITY): Payer: Self-pay | Admitting: Emergency Medicine

## 2014-11-18 DIAGNOSIS — B269 Mumps without complication: Secondary | ICD-10-CM

## 2014-11-18 NOTE — ED Provider Notes (Signed)
CSN: [id number]     Arrival date & time 11/18/14  1300 History   First MD Initiated Contact with Patient 11/18/14 1320     Chief Complaint  Patient presents with  . parotitis    (Consider location/radiation/quality/duration/timing/severity/associated sxs/prior Treatment) Patient is a 34 y.o. male presenting with general illness. The history is provided by the patient.  Illness Severity:  Moderate Onset quality:  Gradual Duration:  9 days Progression:  Worsening Chronicity:  New Context:  Was seen here and in ER on 8/10 , then back in ER 8/13 last visit had ct and stsrted on augmentin but no improvement in facial swelling. Associated symptoms: fever     Past Medical History  Diagnosis Date  . Migraine    Past Surgical History  Procedure Laterality Date  . Knee surgery      right x 2   History reviewed. No pertinent family history. Social History  Substance Use Topics  . Smoking status: Never Smoker   . Smokeless tobacco: None  . Alcohol Use: No    Review of Systems  Constitutional: Positive for fever.  HENT: Positive for facial swelling.     Allergies  Review of patient's allergies indicates no known allergies.  Home Medications   Prior to Admission medications   Medication Sig Start Date End Date Taking? Authorizing Provider  amoxicillin-clavulanate (AUGMENTIN) 875-125 MG per tablet Take 1 tablet by mouth 2 (two) times daily. 11/13/14  Yes Dione Booze, MD  ibuprofen (ADVIL,MOTRIN) 200 MG tablet Take 400 mg by mouth every 6 (six) hours as needed for moderate pain.   Yes Historical Provider, MD  Ibuprofen-Diphenhydramine Cit (IBUPROFEN PM) 200-38 MG TABS Take 2 tablets by mouth.   Yes Historical Provider, MD  oxyCODONE-acetaminophen (PERCOCET) 7.5-325 MG per tablet Take 1 tablet by mouth every 4 (four) hours as needed for severe pain. 11/13/14  Yes Dione Booze, MD  HYDROcodone-acetaminophen (NORCO/VICODIN) 5-325 MG per tablet Take 1 tablet by mouth every 6 (six) hours  as needed for moderate pain. 11/10/14   Lyndal Pulley, MD  naproxen sodium (ANAPROX) 220 MG tablet Take 440 mg by mouth daily as needed (general pain).    Historical Provider, MD   BP 121/73 mmHg  Pulse 81  Temp(Src) 98.3 F (36.8 C) (Oral)  Resp 16  SpO2 97% Physical Exam  Constitutional: He is oriented to person, place, and time. He appears well-developed and well-nourished. No distress.  HENT:  Right Ear: External ear normal.  Left Ear: External ear normal.  Left parotid sts and tenderness.  Eyes: Conjunctivae are normal. Pupils are equal, round, and reactive to light.  Neck: Normal range of motion. Neck supple.  Lymphadenopathy:    He has no cervical adenopathy.  Neurological: He is alert and oriented to person, place, and time.  Skin: Skin is warm and dry.  Nursing note and vitals reviewed.   ED Course  Procedures (including critical care time) Labs Review Labs Reviewed - No data to display  Imaging Review No results found.   MDM   1. Infectious parotitis        Linna Hoff, MD 11/18/14 1359

## 2014-11-18 NOTE — Discharge Instructions (Signed)
Go directly to see dr Lazarus Salines

## 2014-11-18 NOTE — ED Notes (Signed)
Pt was diagnosed with Parotitis on 8/13 and sent home with amoxicillin and Percocet and a referral to an ENT.  Pt is not able to get an appointment with an ENT until the first of September and he is still in a lot of pain and his face is still very swollen.  Pt wants to know if he can be written out of work until the first of the month, will probably need a refill on his pain medication, and wants to know if we are able to get him a referral to be seen sooner.

## 2014-12-13 ENCOUNTER — Ambulatory Visit: Payer: BLUE CROSS/BLUE SHIELD | Admitting: Internal Medicine

## 2015-05-28 ENCOUNTER — Emergency Department (HOSPITAL_COMMUNITY): Payer: BLUE CROSS/BLUE SHIELD

## 2015-05-28 ENCOUNTER — Emergency Department (HOSPITAL_COMMUNITY)
Admission: EM | Admit: 2015-05-28 | Discharge: 2015-05-28 | Disposition: A | Discharge status: Home / Self Care | Payer: BLUE CROSS/BLUE SHIELD | Attending: Emergency Medicine | Admitting: Emergency Medicine

## 2015-05-28 ENCOUNTER — Encounter (HOSPITAL_COMMUNITY): Payer: Self-pay

## 2015-05-28 DIAGNOSIS — R070 Pain in throat: Secondary | ICD-10-CM

## 2015-05-28 DIAGNOSIS — R05 Cough: Secondary | ICD-10-CM | POA: Diagnosis not present

## 2015-05-28 DIAGNOSIS — R0602 Shortness of breath: Secondary | ICD-10-CM | POA: Insufficient documentation

## 2015-05-28 DIAGNOSIS — Z8679 Personal history of other diseases of the circulatory system: Secondary | ICD-10-CM | POA: Insufficient documentation

## 2015-05-28 DIAGNOSIS — R079 Chest pain, unspecified: Secondary | ICD-10-CM | POA: Diagnosis present

## 2015-05-28 LAB — CBC
HEMATOCRIT: 44.8 % (ref 39.0–52.0)
Hemoglobin: 14.8 g/dL (ref 13.0–17.0)
MCH: 27 pg (ref 26.0–34.0)
MCHC: 33 g/dL (ref 30.0–36.0)
MCV: 81.8 fL (ref 78.0–100.0)
PLATELETS: 148 10*3/uL — AB (ref 150–400)
RBC: 5.48 MIL/uL (ref 4.22–5.81)
RDW: 13.8 % (ref 11.5–15.5)
WBC: 5 10*3/uL (ref 4.0–10.5)

## 2015-05-28 LAB — BASIC METABOLIC PANEL
Anion gap: 12 (ref 5–15)
BUN: 10 mg/dL (ref 6–20)
CHLORIDE: 104 mmol/L (ref 101–111)
CO2: 23 mmol/L (ref 22–32)
Calcium: 9.4 mg/dL (ref 8.9–10.3)
Creatinine, Ser: 0.99 mg/dL (ref 0.61–1.24)
Glucose, Bld: 106 mg/dL — ABNORMAL HIGH (ref 65–99)
POTASSIUM: 4.3 mmol/L (ref 3.5–5.1)
SODIUM: 139 mmol/L (ref 135–145)

## 2015-05-28 LAB — I-STAT TROPONIN, ED: Troponin i, poc: 0 ng/mL (ref 0.00–0.08)

## 2015-05-28 MED ORDER — CYCLOBENZAPRINE HCL 10 MG PO TABS
10.0000 mg | ORAL_TABLET | Freq: Once | ORAL | Status: AC
  Administered 2015-05-28: 10 mg via ORAL
  Filled 2015-05-28: qty 1

## 2015-05-28 MED ORDER — OMEPRAZOLE 20 MG PO CPDR: DELAYED_RELEASE_CAPSULE | ORAL | Status: AC

## 2015-05-28 MED ORDER — GI COCKTAIL ~~LOC~~
30.0000 mL | Freq: Once | ORAL | Status: AC
Start: 2015-05-28 — End: 2015-05-28
  Administered 2015-05-28: 30 mL via ORAL
  Filled 2015-05-28: qty 30

## 2015-05-28 MED ORDER — PREDNISONE 20 MG PO TABS
60.0000 mg | ORAL_TABLET | Freq: Once | ORAL | Status: AC
  Administered 2015-05-28: 60 mg via ORAL
  Filled 2015-05-28: qty 3

## 2015-05-28 MED ORDER — ALBUTEROL SULFATE (2.5 MG/3ML) 0.083% IN NEBU
5.0000 mg | INHALATION_SOLUTION | Freq: Once | RESPIRATORY_TRACT | Status: AC
  Administered 2015-05-28: 5 mg via RESPIRATORY_TRACT
  Filled 2015-05-28: qty 6

## 2015-05-28 MED ORDER — NAPROXEN 250 MG PO TABS
500.0000 mg | ORAL_TABLET | Freq: Once | ORAL | Status: AC
  Administered 2015-05-28: 500 mg via ORAL
  Filled 2015-05-28: qty 2

## 2015-05-28 MED ORDER — IPRATROPIUM BROMIDE 0.02 % IN SOLN
0.5000 mg | Freq: Once | RESPIRATORY_TRACT | Status: AC
  Administered 2015-05-28: 0.5 mg via RESPIRATORY_TRACT
  Filled 2015-05-28: qty 2.5

## 2015-05-28 NOTE — ED Provider Notes (Signed)
CSN: [id number]     Arrival date & time 05/28/15  0005 History  By signing my name below, I, Isaiah Morales, attest that this documentation has been prepared under the direction and in the presence of Isaiah Albe, MD at 0140 Electronically Signed: Evon Slack, ED Scribe. 05/28/2015. 1:58 AM.      Chief Complaint  Patient presents with  . Chest Pain   The history is provided by the patient. No language interpreter was used.   HPI Comments: Isaiah Morales is a 35 y.o. male who presents to the Emergency Department complaining of CP and SOB onset 1 week prior. Pt states that he feels as if he is being "chocked." He indicates his anterior lower neck region where the discomfort is located. Pt denies that his symptoms are worse when eating. Pt does report having improving productive cough starting 1 week prior which is now dry. Pt reports intermittent left sided chest pain that feels as if pins are in his side. The pain only lasts a second. He reports chest tightness as well, he states that he feels as if he is not able to take a full breath. Denies wheezing, nausea or vomiting. He has never had this before. He denies reflux symptoms such as acid burning in his throat. He denies fever or chills. Pt denies tobacco or alcohol use.   PCP none   Past Medical History  Diagnosis Date  . Migraine    Past Surgical History  Procedure Laterality Date  . Knee surgery      right x 2   No family history on file. Social History  Substance Use Topics  . Smoking status: Never Smoker   . Smokeless tobacco: None  . Alcohol Use: No  employed  Review of Systems  Constitutional: Negative for fever.  Respiratory: Positive for cough, chest tightness and shortness of breath.   Cardiovascular: Positive for chest pain.  Gastrointestinal: Negative for nausea and vomiting.  All other systems reviewed and are negative.    Allergies  Review of patient's allergies indicates no known allergies.  Home  Medications   Prior to Admission medications   Medication Sig Start Date End Date Taking? Authorizing Provider  amoxicillin-clavulanate (AUGMENTIN) 875-125 MG per tablet Take 1 tablet by mouth 2 (two) times daily. Patient not taking: Reported on 05/28/2015 11/13/14   Dione Booze, MD  HYDROcodone-acetaminophen (NORCO/VICODIN) 5-325 MG per tablet Take 1 tablet by mouth every 6 (six) hours as needed for moderate pain. Patient not taking: Reported on 05/28/2015 11/10/14   Lyndal Pulley, MD  omeprazole (PRILOSEC) 20 MG capsule Take 1 po BID x 2 weeks then once a day 05/28/15   Isaiah Albe, MD  oxyCODONE-acetaminophen (PERCOCET) 7.5-325 MG per tablet Take 1 tablet by mouth every 4 (four) hours as needed for severe pain. Patient not taking: Reported on 05/28/2015 11/13/14   Dione Booze, MD   BP 111/79 mmHg  Pulse 68  Temp(Src) 98.3 F (36.8 C) (Oral)  Resp 11  Ht  (1.778 m)  Wt 190 lb (86.183 kg)  BMI 27.26 kg/m2  SpO2 97%   Physical Exam  Constitutional: He is oriented to person, place, and time. He appears well-developed and well-nourished.  Non-toxic appearance. He does not appear ill. No distress.  HENT:  Head: Normocephalic and atraumatic.  Right Ear: External ear normal.  Left Ear: External ear normal.  Nose: Nose normal. No mucosal edema or rhinorrhea.  Mouth/Throat: Oropharynx is clear and moist and mucous membranes are normal. No  dental abscesses or uvula swelling.  Eyes: Conjunctivae and EOM are normal. Pupils are equal, round, and reactive to light.  Neck: Normal range of motion and full passive range of motion without pain. Neck supple.  Cardiovascular: Normal rate, regular rhythm and normal heart sounds.  Exam reveals no gallop and no friction rub.   No murmur heard. Pulmonary/Chest: Effort normal. No respiratory distress. He has no wheezes. He has no rhonchi. He has no rales. He exhibits no tenderness and no crepitus.  diminished breath sounds.   Abdominal: Soft. Normal  appearance and bowel sounds are normal. He exhibits no distension. There is no tenderness. There is no rebound and no guarding.  Musculoskeletal: Normal range of motion. He exhibits no edema or tenderness.  Moves all extremities well.   Neurological: He is alert and oriented to person, place, and time. He has normal strength. No cranial nerve deficit.  Skin: Skin is warm, dry and intact. No rash noted. No erythema. No pallor.  Psychiatric: He has a normal mood and affect. His speech is normal and behavior is normal. His mood appears not anxious.  Nursing note and vitals reviewed.   ED Course  Procedures (including critical care time)  Medications  albuterol (PROVENTIL) (2.5 MG/3ML) 0.083% nebulizer solution 5 mg (5 mg Nebulization Given 05/28/15 0235)  ipratropium (ATROVENT) nebulizer solution 0.5 mg (0.5 mg Nebulization Given 05/28/15 0235)  predniSONE (DELTASONE) tablet 60 mg (60 mg Oral Given 05/28/15 0234)  naproxen (NAPROSYN) tablet 500 mg (500 mg Oral Given 05/28/15 0401)  cyclobenzaprine (FLEXERIL) tablet 10 mg (10 mg Oral Given 05/28/15 0401)  gi cocktail (Maalox,Lidocaine,Donnatal) (30 mLs Oral Given 05/28/15 0402)    DIAGNOSTIC STUDIES: Oxygen Saturation is 97% on RA, normal by my interpretation.    COORDINATION OF CARE: 1:58 AM-Discussed treatment plan with pt at bedside and pt agreed to plan.  Patient was given a nebulizer treatment to see if that would help with his feeling that his chest is tight. Was given prednisone.  3:22 AM- rechecked with Pt. He states that breathing treatment did not provide relief and that the pain is worse. He was then given a GI cocktail, naproxen, and Flexeril.  Patient's pain is gone at time of discharge. It is not clear to me what is the etiology of the patient's pain. He indicates the pain is in his lower throat however he has no fever, difficulty swallowing or breathing. A nebulizer treatment did not help his feeling of chest tightness. He was sent  home on a PPI.   Labs Review Results for orders placed or performed during the hospital encounter of 05/28/15  Basic metabolic panel  Result Value Ref Range   Sodium 139 135 - 145 mmol/L   Potassium 4.3 3.5 - 5.1 mmol/L   Chloride 104 101 - 111 mmol/L   CO2 23 22 - 32 mmol/L   Glucose, Bld 106 (H) 65 - 99 mg/dL   BUN 10 6 - 20 mg/dL   Creatinine, Ser 1.61 0.61 - 1.24 mg/dL   Calcium 9.4 8.9 - 09.6 mg/dL   GFR calc non Af Amer >60 >60 mL/min   GFR calc Af Amer >60 >60 mL/min   Anion gap 12 5 - 15  CBC  Result Value Ref Range   WBC 5.0 4.0 - 10.5 K/uL   RBC 5.48 4.22 - 5.81 MIL/uL   Hemoglobin 14.8 13.0 - 17.0 g/dL   HCT 04.5 40.9 - 81.1 %   MCV 81.8 78.0 - 100.0 fL  MCH 27.0 26.0 - 34.0 pg   MCHC 33.0 30.0 - 36.0 g/dL   RDW 09.6 04.5 - 40.9 %   Platelets 148 (L) 150 - 400 K/uL  I-stat troponin, ED (not at [facility], [facility])  Result Value Ref Range   Troponin i, poc 0.00 0.00 - 0.08 ng/mL   Comment 3           Laboratory interpretation all normal    Imaging Review Dg Chest 2 View  05/28/2015  CLINICAL DATA:  Left-sided chest pain, symptoms for 1 month worsening today. EXAM: CHEST  2 VIEW COMPARISON:  06/15/2012 FINDINGS: The cardiomediastinal contours are normal. Borderline hyperinflation. Pulmonary vasculature is normal. No consolidation, pleural effusion, or pneumothorax. No acute osseous abnormalities are seen. IMPRESSION: Borderline hyperinflation without localizing process. Electronically Signed   By: Rubye Oaks M.D.   On: 05/28/2015 00:36   I have personally reviewed and evaluated these images and lab results as part of my medical decision-making.   EKG Interpretation   Date/Time:  Sunday May 28 2015 00:09:44 EST Ventricular Rate:  79 PR Interval:  196 QRS Duration: 90 QT Interval:  368 QTC Calculation: 421 R Axis:   95 Text Interpretation:  Normal sinus rhythm Rightward axis Nonspecific ST  and T wave abnormality No significant change since last tracing  15 Jun 2012 Confirmed by Randy Whitener  MD-I, Meleane Selinger ([id number]) on 05/28/2015 6:20:00 AM      MDM   Final diagnoses:  Throat pain in adult   Discharge Medication List as of 05/28/2015  6:13 AM    START taking these medications   Details  omeprazole (PRILOSEC) 20 MG capsule Take 1 po BID x 2 weeks then once a day, Print        Plan discharge  Isaiah Albe, MD, FACEP   I personally performed the services described in this documentation, which was scribed in my presence. The recorded information has been reviewed and considered.  Isaiah Albe, MD, Concha Pyo, MD 05/28/15 [phone]

## 2015-05-28 NOTE — ED Notes (Signed)
Pt states he has had intermittent chest pain for a month now but got worse today while walking around at work.

## 2015-05-28 NOTE — Discharge Instructions (Signed)
Try sore throat lozenges to see if that will help with your pain. Take the omeprazole as prescribed. Recheck if you get fever, you are unable to swallow or you breath.

## 2015-07-28 ENCOUNTER — Ambulatory Visit (INDEPENDENT_AMBULATORY_CARE_PROVIDER_SITE_OTHER): Payer: BLUE CROSS/BLUE SHIELD

## 2015-07-28 ENCOUNTER — Encounter (HOSPITAL_COMMUNITY): Payer: Self-pay

## 2015-07-28 ENCOUNTER — Ambulatory Visit (HOSPITAL_COMMUNITY)
Admission: EM | Admit: 2015-07-28 | Discharge: 2015-07-28 | Disposition: A | Discharge status: Home / Self Care | Payer: BLUE CROSS/BLUE SHIELD | Attending: Family Medicine | Admitting: Family Medicine

## 2015-07-28 DIAGNOSIS — S97121A Crushing injury of right lesser toe(s), initial encounter: Secondary | ICD-10-CM

## 2015-07-28 MED ORDER — IBUPROFEN 800 MG PO TABS
ORAL_TABLET | ORAL | Status: AC
Start: 2015-07-28 — End: 2015-07-28
  Filled 2015-07-28: qty 1

## 2015-07-28 MED ORDER — IBUPROFEN 800 MG PO TABS
800.0000 mg | ORAL_TABLET | Freq: Once | ORAL | Status: AC
  Administered 2015-07-28: 800 mg via ORAL

## 2015-07-28 NOTE — ED Provider Notes (Addendum)
CSN: [id number]     Arrival date & time 07/28/15  1506 History   First MD Initiated Contact with Patient 07/28/15 1642     Chief Complaint  Patient presents with  . Toe Injury   (Consider location/radiation/quality/duration/timing/severity/associated sxs/prior Treatment) The history is provided by the patient. No language interpreter was used.  Presents with sudden onset pain in R fifth toe at 14:15pm today, slammed into a brick while laying floor. Did not have closed-toe shoes on.  Came to urgent care for evaluation.   No fevers or chills, no prior injury to the R foot.   Past Medical History  Diagnosis Date  . Migraine    Past Surgical History  Procedure Laterality Date  . Knee surgery      right x 2   No family history on file. Social History  Substance Use Topics  . Smoking status: Never Smoker   . Smokeless tobacco: None  . Alcohol Use: No    Review of Systems  Allergies  Review of patient's allergies indicates no known allergies.  Home Medications   Prior to Admission medications   Medication Sig Start Date End Date Taking? Authorizing Provider  amoxicillin-clavulanate (AUGMENTIN) 875-125 MG per tablet Take 1 tablet by mouth 2 (two) times daily. Patient not taking: Reported on 05/28/2015 11/13/14   Dione Boozeavid Glick, MD  HYDROcodone-acetaminophen (NORCO/VICODIN) 5-325 MG per tablet Take 1 tablet by mouth every 6 (six) hours as needed for moderate pain. Patient not taking: Reported on 05/28/2015 11/10/14   Lyndal Pulleyaniel Knott, MD  omeprazole (PRILOSEC) 20 MG capsule Take 1 po BID x 2 weeks then once a day 05/28/15   Devoria AlbeIva Knapp, MD  oxyCODONE-acetaminophen (PERCOCET) 7.5-325 MG per tablet Take 1 tablet by mouth every 4 (four) hours as needed for severe pain. Patient not taking: Reported on 05/28/2015 11/13/14   Dione Boozeavid Glick, MD   Meds Ordered and Administered this Visit  Medications - No data to display  BP 119/81 mmHg  Pulse 71  Temp(Src) 98.6 F (37 C) (Oral)  Resp 16  SpO2  100% No data found.   Physical Exam  Constitutional: He appears well-developed and well-nourished. No distress.  Musculoskeletal:  RIGHT fifth toe with laceration on tip of toe; painful to palpate over distal tip of toe. Bleeding controlled, not actively bleeding.   Able to move toe spontaneously.   Palpable dp pulse in R foot. No tenderness over R 5th metatarsal, heel, or ankle> Able to dorsi/plantarflex R ankle withotu limitation.   Skin: He is not diaphoretic.    ED Course  Procedures (including critical care time)  Labs Review Labs Reviewed - No data to display  Imaging Review No results found.   Visual Acuity Review  Right Eye Distance:   Left Eye Distance:   Bilateral Distance:    Right Eye Near:   Left Eye Near:    Bilateral Near:         MDM   1. Crushing injury of fifth toe, right, initial encounter    XR no fracture to R 5th toe by x-ray (films reviewed by me personally during visit).  Abrasion, trimmed some of redundant skin (not amenable to suture).  Topical abx and buddy tape, post op shoe. Last Td less than 5 years ago (when he stepped on rusty nail).     Paula ComptonJames Anyely Cunning, MD    Barbaraann BarthelJames O Judaea Burgoon, MD 07/28/15 1742  Barbaraann BarthelJames O Malin Sambrano, MD 07/28/15 1742  Barbaraann BarthelJames O Ladana Chavero, MD 07/28/15 Cherie [id number]  Marta LamasJames O  Mauricio Po, MD 07/28/15 1800

## 2015-07-28 NOTE — Discharge Instructions (Signed)
It is a pleasure to see you today.  The x-rays show no fracture of the right fifth toe.   Ibuprofen 400-800mg  by mouth every 6 to 8 hours as needed for pain; elevation and ice for now. Avoid running for now.

## 2015-07-28 NOTE — ED Notes (Signed)
Bed: UC08 Expected date:  Expected time:  Means of arrival:  Comments: Dr. Piedad ClimesHonig

## 2015-07-28 NOTE — ED Notes (Signed)
Patient presents with a injured pinky toe, he states he was sanding down some hardwood floors and he stomped his toe on a brick, incident took place about 2 hrs ago No acute distress

## 2016-07-13 IMAGING — DX DG FOOT COMPLETE 3+V*R*
3 series · 3 of 3 positions shown · non-contrast
Comparison: None.

CLINICAL DATA: Fifth toe injury today 3 hours prior. Open wound of
the little toe.

EXAM:
RIGHT FOOT COMPLETE - 3+ VIEW

[foot ap]
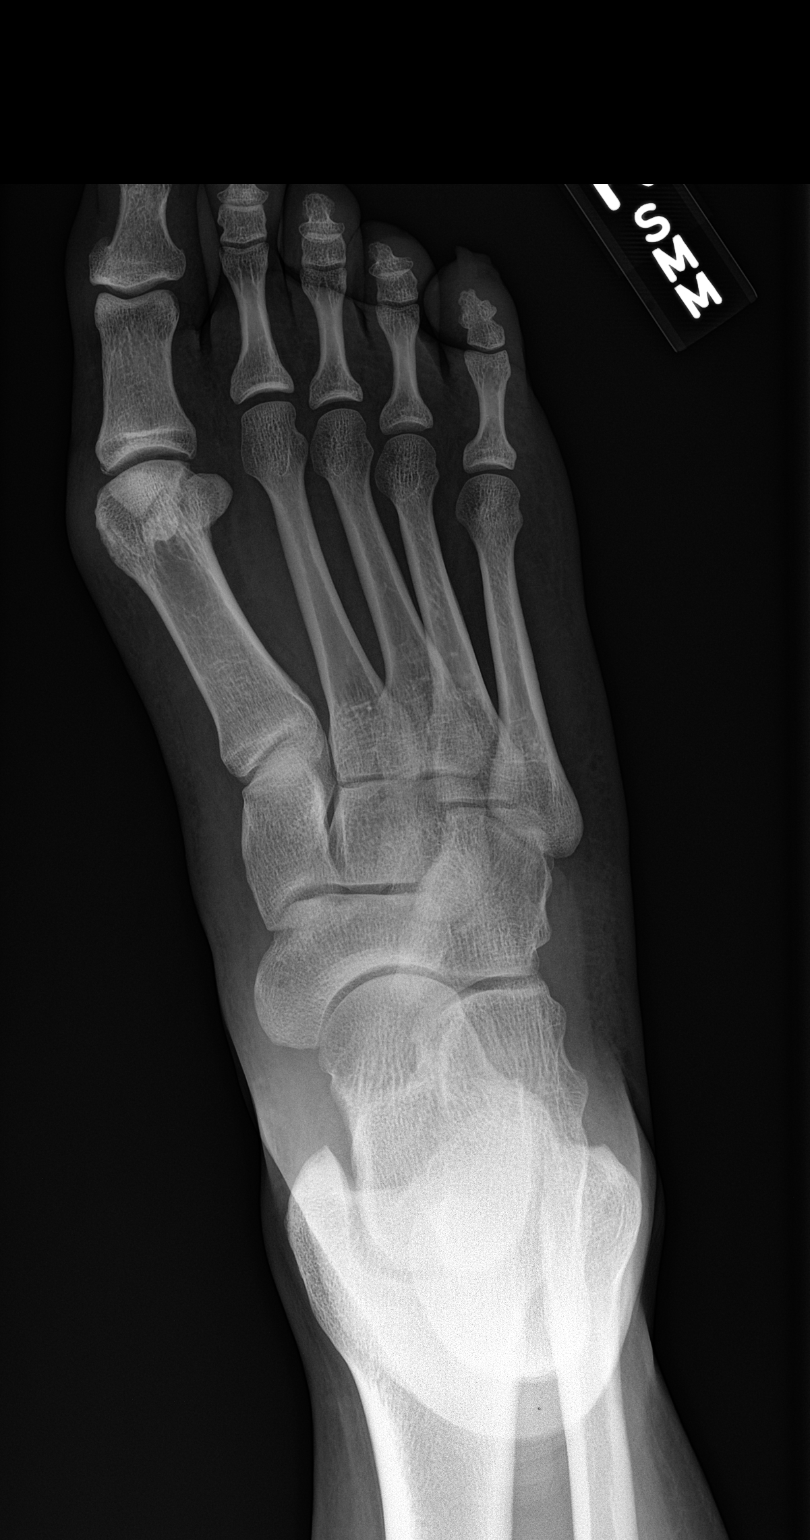

[foot obl]
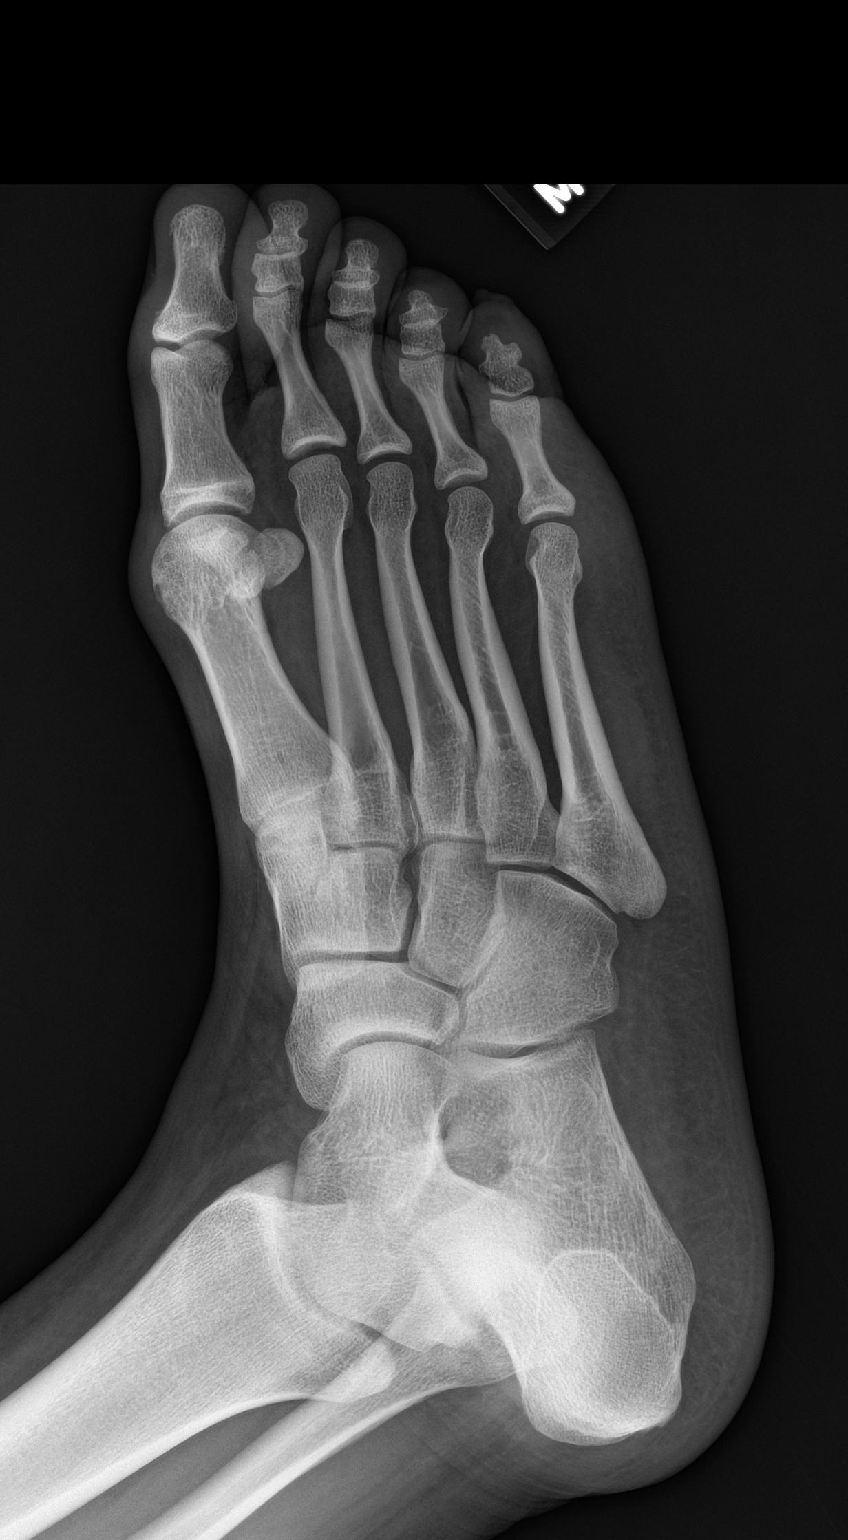

[foot lat]
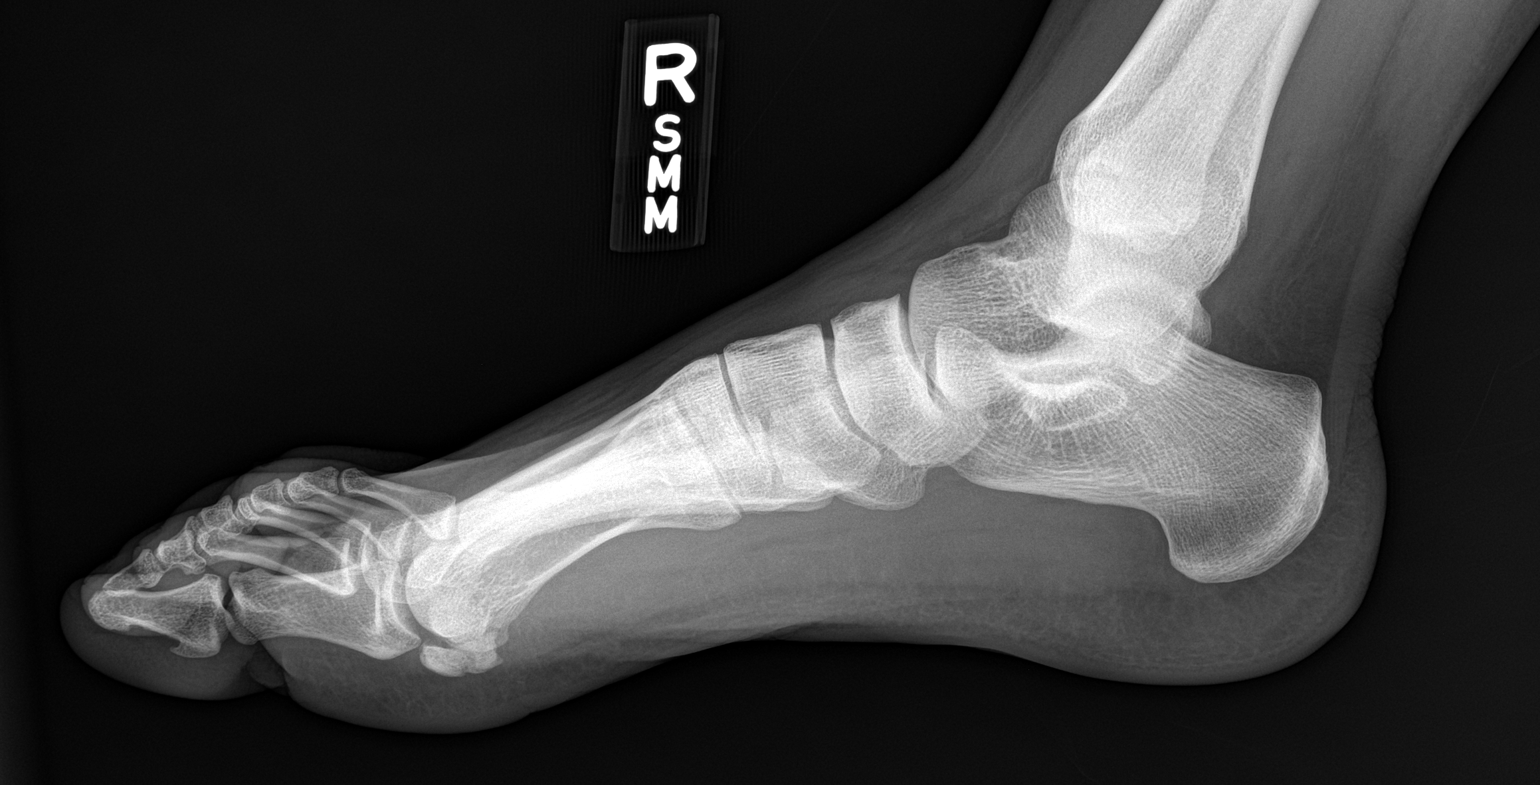

[3 of 3 positions shown; findings below may reference images not displayed]

FINDINGS: There is no evidence of fracture or dislocation. There is no
evidence of arthropathy or other focal bone abnormality. Edema of
the fifth toe with questionable soft tissue defect distal. No
radiopaque foreign body.
IMPRESSION: Soft tissue injury to the little toe. No radiopaque foreign body,
fracture or dislocation.
